# Patient Record
Sex: Male | Born: 1985 | Race: Black or African American | Hispanic: No | Marital: Single | Smoking: Never smoker
Health system: Southern US, Community
[De-identification: ages and names within clinical notes are randomized; demographics above are authoritative.]

## PROBLEM LIST (undated history)

## (undated) DIAGNOSIS — M6282 Rhabdomyolysis: Secondary | ICD-10-CM

## (undated) HISTORY — DX: Rhabdomyolysis: M62.82

---

## 2014-03-13 ENCOUNTER — Encounter (HOSPITAL_COMMUNITY): Payer: Self-pay | Admitting: Emergency Medicine

## 2014-03-13 ENCOUNTER — Emergency Department (HOSPITAL_COMMUNITY)
Admission: EM | Admit: 2014-03-13 | Discharge: 2014-03-13 | Disposition: A | Discharge status: Home / Self Care | Payer: Managed Care, Other (non HMO) | Source: Home / Self Care

## 2014-03-13 DIAGNOSIS — A084 Viral intestinal infection, unspecified: Secondary | ICD-10-CM

## 2014-03-13 DIAGNOSIS — A088 Other specified intestinal infections: Secondary | ICD-10-CM

## 2014-03-13 DIAGNOSIS — R55 Syncope and collapse: Secondary | ICD-10-CM

## 2014-03-13 DIAGNOSIS — S0510XA Contusion of eyeball and orbital tissues, unspecified eye, initial encounter: Secondary | ICD-10-CM

## 2014-03-13 DIAGNOSIS — S0511XA Contusion of eyeball and orbital tissues, right eye, initial encounter: Secondary | ICD-10-CM

## 2014-03-13 MED ORDER — ONDANSETRON 4 MG PO TBDP
ORAL_TABLET | ORAL | Status: AC
  Filled 2014-03-13: qty 1

## 2014-03-13 MED ORDER — ONDANSETRON 4 MG PO TBDP
4.0000 mg | ORAL_TABLET | Freq: Once | ORAL | Status: AC
  Administered 2014-03-13: 4 mg via ORAL

## 2014-03-13 MED ORDER — ONDANSETRON HCL 4 MG PO TABS: 4.0000 mg | ORAL_TABLET | Freq: Four times a day (QID) | ORAL | Status: DC

## 2014-03-13 NOTE — ED Notes (Signed)
Was vomiting while sitting on the toilet and passed out. States he hit his head on the floor.  Has swelling over L eye.  Vision is normal.  Sx. Onset 12 MN .   Vomited and Diarrhea x 10 since then.  Also c/o pain R knee.  No swelling or abrasions noted.  C/o cramps in abdomen.

## 2014-03-13 NOTE — Discharge Instructions (Signed)
Contusion A contusion is a deep bruise. Contusions are the result of an injury that caused bleeding under the skin. The contusion may turn blue, purple, or yellow. Minor injuries will give you a painless contusion, but more severe contusions may stay painful and swollen for a few weeks.  CAUSES  A contusion is usually caused by a blow, trauma, or direct force to an area of the body. SYMPTOMS   Swelling and redness of the injured area.  Bruising of the injured area.  Tenderness and soreness of the injured area.  Pain. DIAGNOSIS  The diagnosis can be made by taking a history and physical exam. An X-ray, CT scan, or MRI may be needed to determine if there were any associated injuries, such as fractures. TREATMENT  Specific treatment will depend on what area of the body was injured. In general, the best treatment for a contusion is resting, icing, elevating, and applying cold compresses to the injured area. Over-the-counter medicines may also be recommended for pain control. Ask your caregiver what the best treatment is for your contusion. HOME CARE INSTRUCTIONS   Put ice on the injured area.  Put ice in a plastic bag.  Place a towel between your skin and the bag.  Leave the ice on for 15-20 minutes, 03-04 times a day.  Only take over-the-counter or prescription medicines for pain, discomfort, or fever as directed by your caregiver. Your caregiver may recommend avoiding anti-inflammatory medicines (aspirin, ibuprofen, and naproxen) for 48 hours because these medicines may increase bruising.  Rest the injured area.  If possible, elevate the injured area to reduce swelling. SEEK IMMEDIATE MEDICAL CARE IF:   You have increased bruising or swelling.  You have pain that is getting worse.  Your swelling or pain is not relieved with medicines. MAKE SURE YOU:   Understand these instructions.  Will watch your condition.  Will get help right away if you are not doing well or get  worse. Document Released: 09/11/2005 Document Revised: 02/24/2012 Document Reviewed: 10/07/2011 [facility] Patient Information 2014 [place], [place].  Viral Gastroenteritis Viral gastroenteritis is also known as stomach flu. This condition affects the stomach and intestinal tract. It can cause sudden diarrhea and vomiting. The illness typically lasts 3 to 8 days. Most people develop an immune response that eventually gets rid of the virus. While this natural response develops, the virus can make you quite ill. CAUSES  Many different viruses can cause gastroenteritis, such as rotavirus or noroviruses. You can catch one of these viruses by consuming contaminated food or water. You may also catch a virus by sharing utensils or other personal items with an infected person or by touching a contaminated surface. SYMPTOMS  The most common symptoms are diarrhea and vomiting. These problems can cause a severe loss of body fluids (dehydration) and a body salt (electrolyte) imbalance. Other symptoms may include:  Fever.  Headache.  Fatigue.  Abdominal pain. DIAGNOSIS  Your caregiver can usually diagnose viral gastroenteritis based on your symptoms and a physical exam. A stool sample may also be taken to test for the presence of viruses or other infections. TREATMENT  This illness typically goes away on its own. Treatments are aimed at rehydration. The most serious cases of viral gastroenteritis involve vomiting so severely that you are not able to keep fluids down. In these cases, fluids must be given through an intravenous line (IV). HOME CARE INSTRUCTIONS   Drink enough fluids to keep your urine clear or pale yellow. Drink small amounts of  fluids frequently and increase the amounts as tolerated.  Ask your caregiver for specific rehydration instructions.  Avoid:  Foods high in sugar.  Alcohol.  Carbonated drinks.  Tobacco.  Juice.  Caffeine drinks.  Extremely hot or cold  fluids.  Fatty, greasy foods.  Too much intake of anything at one time.  Dairy products until 24 to 48 hours after diarrhea stops.  You may consume probiotics. Probiotics are active cultures of beneficial bacteria. They may lessen the amount and number of diarrheal stools in adults. Probiotics can be found in yogurt with active cultures and in supplements.  Wash your hands well to avoid spreading the virus.  Only take over-the-counter or prescription medicines for pain, discomfort, or fever as directed by your caregiver. Do not give aspirin to children. Antidiarrheal medicines are not recommended.  Ask your caregiver if you should continue to take your regular prescribed and over-the-counter medicines.  Keep all follow-up appointments as directed by your caregiver. SEEK IMMEDIATE MEDICAL CARE IF:   You are unable to keep fluids down.  You do not urinate at least once every 6 to 8 hours.  You develop shortness of breath.  You notice blood in your stool or vomit. This may look like coffee grounds.  You have abdominal pain that increases or is concentrated in one small area (localized).  You have persistent vomiting or diarrhea.  You have a fever.  The patient is a child younger than 3 months, and he or she has a fever.  The patient is a child older than 3 months, and he or she has a fever and persistent symptoms.  The patient is a child older than 3 months, and he or she has a fever and symptoms suddenly get worse.  The patient is a baby, and he or she has no tears when crying. MAKE SURE YOU:   Understand these instructions.  Will watch your condition.  Will get help right away if you are not doing well or get worse. Document Released: 12/02/2005 Document Revised: 02/24/2012 Document Reviewed: 09/18/2011 [facility] Patient Information 2014 [place], [place].  Vasovagal Syncope, Adult Syncope, commonly known as fainting, is a temporary loss of consciousness. It occurs when  the blood flow to the brain is reduced. Vasovagal syncope (also called neurocardiogenic syncope) is a fainting spell in which the blood flow to the brain is reduced because of a sudden drop in heart rate and blood pressure. Vasovagal syncope occurs when the brain and the cardiovascular system (blood vessels) do not adequately communicate and respond to each other. This is the most common cause of fainting. It often occurs in response to fear or some other type of emotional or physical stress. The body has a reaction in which the heart starts beating too slowly or the blood vessels expand, reducing blood pressure. This type of fainting spell is generally considered harmless. However, injuries can occur if a person takes a sudden fall during a fainting spell.  CAUSES  Vasovagal syncope occurs when a person's blood pressure and heart rate decrease suddenly, usually in response to a trigger. Many things and situations can trigger an episode. Some of these include:   Pain.   Fear.   The sight of blood or medical procedures, such as blood being drawn from a vein.   Common activities, such as coughing, swallowing, stretching, or going to the bathroom.   Emotional stress.   Prolonged standing, especially in a warm environment.   Lack of sleep or rest.   Prolonged  lack of food.   Prolonged lack of fluids.   Recent illness.  The use of certain drugs that affect blood pressure, such as cocaine, alcohol, marijuana, inhalants, and opiates.  SYMPTOMS  Before the fainting episode, you may:   Feel dizzy or light headed.   Become pale.  Sense that you are going to faint.   Feel like the room is spinning.   Have tunnel vision, only seeing directly in front of you.   Feel sick to your stomach (nauseous).   See spots or slowly lose vision.   Hear ringing in your ears.   Have a headache.   Feel warm and sweaty.   Feel a sensation of pins and needles. During the fainting  spell, you will generally be unconscious for no longer than a couple minutes before waking up and returning to normal. If you get up too quickly before your body can recover, you may faint again. Some twitching or jerky movements may occur during the fainting spell.  DIAGNOSIS  Your caregiver will ask about your symptoms, take a medical history, and perform a physical exam. Various tests may be done to rule out other causes of fainting. These may include blood tests and tests to check the heart, such as electrocardiography, echocardiography, and possibly an electrophysiology study. When other causes have been ruled out, a test may be done to check the body's response to changes in position (tilt table test). TREATMENT  Most cases of vasovagal syncope do not require treatment. Your caregiver may recommend ways to avoid fainting triggers and may provide home strategies for preventing fainting. If you must be exposed to a possible trigger, you can drink additional fluids to help reduce your chances of having an episode of vasovagal syncope. If you have warning signs of an oncoming episode, you can respond by positioning yourself favorably (lying down). If your fainting spells continue, you may be given medicines to prevent fainting. Some medicines may help make you more resistant to repeated episodes of vasovagal syncope. Special exercises or compression stockings may be recommended. In rare cases, the surgical placement of a pacemaker is considered. HOME CARE INSTRUCTIONS   Learn to identify the warning signs of vasovagal syncope.   Sit or lie down at the first warning sign of a fainting spell. If sitting, put your head down between your legs. If you lie down, swing your legs up in the air to increase blood flow to the brain.   Avoid hot tubs and saunas.  Avoid prolonged standing.  Drink enough fluids to keep your urine clear or pale yellow. Avoid caffeine.  Increase salt in your diet as directed  by your caregiver.   If you have to stand for a long time, perform movements such as:   Crossing your legs.   Flexing and stretching your leg muscles.   Squatting.   Moving your legs.   Bending over.   Only take over-the-counter or prescription medicines as directed by your caregiver. Do not suddenly stop any medicines without asking your caregiver first. SEEK MEDICAL CARE IF:   Your fainting spells continue or happen more frequently in spite of treatment.   You lose consciousness for more than a couple minutes.  You have fainting spells during or after exercising or after being startled.   You have new symptoms that occur with the fainting spells, such as:   Shortness of breath.  Chest pain.   Irregular heartbeat.   You have episodes of twitching or jerky movements  that last longer than a few seconds.  You have episodes of twitching or jerky movements without obvious fainting. SEEK IMMEDIATE MEDICAL CARE IF:   You have injuries or bleeding after a fainting spell.   You have episodes of twitching or jerky movements that last longer than 5 minutes.   You have more than one spell of twitching or jerky movements before returning to consciousness after fainting. MAKE SURE YOU:   Understand these instructions.  Will watch your condition.  Will get help right away if you are not doing well or get worse. Document Released: 11/18/2012 Document Reviewed: 11/18/2012 [facility] Patient Information 2014 [place], [place].  Head Injury, Adult You have a head injury. Headaches and throwing up (vomiting) are common after a head injury. It should be easy to wake up from sleeping. Sometimes you must stay in the hospital. Most problems happen within the first 24 hours. Side effects may occur up to 7 10 days after the injury.  WHAT ARE THE TYPES OF HEAD INJURIES? Head injuries can be as minor as a bump. Some head injuries can be more severe. More severe head injuries  include:  A jarring injury to the brain (concussion).  A bruise of the brain (contusion). This mean there is bleeding in the brain that can cause swelling.  A cracked skull (skull fracture).  Bleeding in the brain that collects, clots, and forms a bump (hematoma). . WHEN SHOULD I GET HELP RIGHT AWAY?   You are confused or sleepy.  You cannot be woken up.  You feel sick to your stomach (nauseous) or keep throwing up.  Your dizziness or unsteadiness is get worse.  You have very bad, lasting headaches that are not helped by medicine.  You cannot use your arms or legs like normal  You cannot walk.  You notice changes in the black spots in the center of the colored part of your eye (pupil).  You have clear or bloody fluid coming from your nose or ears.  You have trouble seeing. During the next 24 hours after the injury, you must stay with someone who can watch you. This person should get help right away (call 911 in the [place] if you start to shake and are not able to control it (seizures), you become pass out, or you are unable to wake up. HOW CAN I PREVENT A HEAD INJURY IN THE FUTURE?  Wear seat belts.  Wear helmets while bike riding and playing sports like football.  Stay away from dangerous activities around the house. WHEN CAN I RETURN TO NORMAL ACTIVITIES AND ATHLETICS? See your doctor before doing these activities. You should not do normal activities or play contact sports until 1 week after the following symptoms have stopped:  Headache that does not go away.  Dizziness.  Poor attention.  Confusion.  Memory problems.  Sickness to your stomach or throwing up.  Tiredness.  Fussiness.  Bothered by bright lights or loud noises.  Anxiousness or depression.  Restless sleep. MAKE SURE YOU:   Understand these instructions.  Will watch your condition.  Will get help right away if you are not doing well or get worse. Document Released: 11/14/2008 Document  Revised: 09/22/2013 Document Reviewed: 08/09/2013 [facility] Patient Information 2014 [place], [place].

## 2014-03-13 NOTE — ED Provider Notes (Signed)
CSN: [id number]     Arrival date & time 03/13/14  1723 History   First MD Initiated Contact with Patient 03/13/14 1746     Chief Complaint  Patient presents with  . Emesis  . Loss of Consciousness   (Consider location/radiation/quality/duration/timing/severity/associated sxs/prior Treatment) HPI Comments: 28 y o m with sore throat yesterday. This AM at 0200h N,V,D with fever and malaise. V and D several times.  This PM While sitting on toilet and vomiting simultaneously felt weak and passed out for < 1 min. As he fell struck OD on edge of bathtub.  Denies neuro sx'. No problems with vision, speech, hearing, swallowing, focal weakness, memory or confusion.    History reviewed. No pertinent past medical history. History reviewed. No pertinent past surgical history. History reviewed. No pertinent family history. History  Substance Use Topics  . Smoking status: Never Smoker   . Smokeless tobacco: Not on file  . Alcohol Use: No    Review of Systems  Constitutional: Positive for fever, chills, activity change and appetite change.  HENT: Positive for sore throat. Negative for congestion.        Mild periorbital edema, no eccymosis.  Eyes: Negative for discharge and visual disturbance.  Respiratory: Negative.   Cardiovascular: Negative.   Gastrointestinal: Positive for nausea, vomiting and diarrhea. Negative for blood in stool.       Transient low abd cramps.  Genitourinary: Negative.   Musculoskeletal: Negative.   Skin: Negative.   Neurological: Positive for syncope and headaches. Negative for dizziness, tremors, seizures, facial asymmetry, speech difficulty and numbness.  Psychiatric/Behavioral: Negative.  Negative for behavioral problems, confusion, dysphoric mood, decreased concentration and agitation. The patient is not nervous/anxious and is not hyperactive.     Allergies  Review of patient's allergies indicates no known allergies.  Home Medications   Current Outpatient Rx   Name  Route  Sig  Dispense  Refill  . ondansetron (ZOFRAN) 4 MG tablet   Oral   Take 1 tablet (4 mg total) by mouth every 6 (six) hours. Prn nausea or vomiting   12 tablet   0    BP 114/70  Pulse 98  Temp(Src) 100.3 F (37.9 C)  Resp 16  SpO2 97% Physical Exam  Nursing note and vitals reviewed. Constitutional: He is oriented to person, place, and time. He appears well-developed and well-nourished. No distress.  HENT:  Head: Normocephalic.  Nose: Nose normal.  Mouth/Throat: No oropharyngeal exudate.  bilat TM's nl OP with mild PND Mild R periorbital edema/pufiness.   Eyes: Conjunctivae and EOM are normal. Pupils are equal, round, and reactive to light.  No apparent injury to eye proper. Nl exam. Nl vision.  Neck: Normal range of motion. Neck supple.  No spinal tenderness, deformity  Cardiovascular: Normal rate, regular rhythm, normal heart sounds and intact distal pulses.   Pulmonary/Chest: Effort normal and breath sounds normal. No respiratory distress. He has no wheezes. He has no rales.  Abdominal: Soft. Bowel sounds are normal. He exhibits no distension and no mass. There is no tenderness. There is no rebound and no guarding.  Musculoskeletal: He exhibits no edema and no tenderness.  Lymphadenopathy:    He has no cervical adenopathy.  Neurological: He is alert and oriented to person, place, and time. No cranial nerve deficit. He exhibits normal muscle tone.  Skin: Skin is warm and dry. No rash noted. He is not diaphoretic. No erythema.  Psychiatric: He has a normal mood and affect. His behavior is normal. Judgment  and thought content normal.    ED Course  Procedures (including critical care time) Labs Review Labs Reviewed - No data to display Imaging Review No results found.   MDM   1. Viral gastroenteritis   2. Vasovagal episode   3. Periorbital contusion of right eye     zofran 4 mg 1 dose here and Rx  Fluids as discussed Ice to eye bruise. Rest, no  working Head injury sheet and red flags discussed. Brief vasovagal episode due to combination, vomiting, dehydration. Recovered quickly to nl state and no residual neuro sx's/deficits. Stable and ready to go home.       Hayden Rasmussen, NP 03/13/14 1856  Hayden Rasmussen, NP 03/13/14 1859

## 2014-03-16 NOTE — ED Provider Notes (Signed)
Medical screening examination/treatment/procedure(s) were performed by a resident physician or non-physician practitioner and as the supervising physician I was immediately available for consultation/collaboration.  Rayola Everhart, MD    Nigel Wessman S Dorrien Grunder, MD 03/16/14 0758 

## 2015-03-09 ENCOUNTER — Emergency Department (HOSPITAL_COMMUNITY)
Admission: EM | Admit: 2015-03-09 | Discharge: 2015-03-09 | Disposition: A | Discharge status: Home / Self Care | Payer: Managed Care, Other (non HMO) | Source: Home / Self Care | Attending: Family Medicine | Admitting: Family Medicine

## 2015-03-09 ENCOUNTER — Encounter (HOSPITAL_COMMUNITY): Payer: Self-pay | Admitting: Emergency Medicine

## 2015-03-09 DIAGNOSIS — B349 Viral infection, unspecified: Secondary | ICD-10-CM | POA: Diagnosis not present

## 2015-03-09 LAB — POCT RAPID STREP A: Streptococcus, Group A Screen (Direct): NEGATIVE

## 2015-03-09 MED ORDER — ACETAMINOPHEN 325 MG PO TABS
ORAL_TABLET | ORAL | Status: AC
  Filled 2015-03-09: qty 2

## 2015-03-09 MED ORDER — ACETAMINOPHEN 160 MG/5ML PO SOLN
15.0000 mg/kg | Freq: Once | ORAL | Status: AC
  Administered 2015-03-09: 15:00:00 via ORAL

## 2015-03-09 NOTE — Discharge Instructions (Signed)

## 2015-03-09 NOTE — ED Notes (Signed)
Onset of feeling bad started last night.  Patient works night shift.  Patient had a headache last night.  Patient noted having chills last night.  After working came home and vomited x 1.  Headache and fever continues.

## 2015-03-09 NOTE — ED Provider Notes (Signed)
CSN: [id number]     Arrival date & time 03/09/15  1410 History   First MD Initiated Contact with Patient 03/09/15 1621     Chief Complaint  Patient presents with  . Fever   (Consider location/radiation/quality/duration/timing/severity/associated sxs/prior Treatment) HPI Comments: 29 year old male states that yesterday evening around 7:30 PM he developed a headache. He took Motrin with partial relief. 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 He kept feeling colder and colder and developed a sore throat. This morning he vomited once and had one loose stool. This afternoon he states that his hands felt heavy. He denies abdominal pain, chest pain, shortness of breath, earache, nausea, vomiting; and did not know he had a fever until we measured it here with a value of 103.1    History reviewed. No pertinent past medical history. History reviewed. No pertinent past surgical history. No family history on file. History  Substance Use Topics  . Smoking status: Never Smoker   . Smokeless tobacco: Not on file  . Alcohol Use: No    Review of Systems  Constitutional: Positive for chills and activity change. Negative for fever.  HENT: Positive for sore throat. Negative for congestion, ear pain, postnasal drip and rhinorrhea.   Respiratory: Negative for cough, chest tightness and shortness of breath.   Cardiovascular: Negative for chest pain, palpitations and leg swelling.  Gastrointestinal: Negative for abdominal pain.       As per history of present illness  Genitourinary: Positive for frequency. Negative for dysuria, urgency, discharge, penile pain and testicular pain.  Musculoskeletal: Negative for back pain, neck pain and neck stiffness.  Skin: Negative for rash.  Neurological: Positive for weakness. Negative for dizziness and numbness.    Allergies  Review of patient's allergies indicates no known allergies.  Home Medications   Prior to Admission medications    Medication Sig Start Date End Date Taking? Authorizing Provider  ondansetron (ZOFRAN) 4 MG tablet Take 1 tablet (4 mg total) by mouth every 6 (six) hours. Prn nausea or vomiting 03/13/14   Hayden Rasmussenavid Calab Sachse, NP   BP 90/55 mmHg  Pulse 103  Temp(Src) 103.1 F (39.5 C) (Oral)  Resp 18  SpO2 95% Physical Exam  Constitutional: He is oriented to person, place, and time. He appears well-developed and well-nourished. No distress.  HENT:  Head: Normocephalic and atraumatic.  Mouth/Throat: No oropharyngeal exudate.  Bilateral TMs are normal Oropharynx with cobblestoning, minor erythema and clear PND.  Eyes: Conjunctivae and EOM are normal.  Neck: Normal range of motion. Neck supple.  Cardiovascular: Normal rate, regular rhythm and normal heart sounds.   Pulmonary/Chest: Effort normal and breath sounds normal. No respiratory distress. He has no wheezes. He has no rales.  Abdominal: Soft. Bowel sounds are normal. He exhibits no distension and no mass. There is no tenderness. There is no rebound and no guarding.  Scaphoid.  Musculoskeletal: Normal range of motion. He exhibits no edema.  Lymphadenopathy:    He has no cervical adenopathy.  Neurological: He is alert and oriented to person, place, and time.  Skin: Skin is warm and dry. No rash noted. He is not diaphoretic.  Psychiatric: He has a normal mood and affect.  Nursing note and vitals reviewed.   ED Course  Procedures (including critical care time) Labs Review Labs Reviewed  POCT RAPID STREP A ([facility] ONLY)   Results for orders placed or performed during the hospital encounter of 03/09/15  POCT rapid strep  A [facility])  Result Value Ref Range   Streptococcus, Group A Screen (Direct) NEGATIVE NEGATIVE   Imaging Review No results found.   MDM   1. Viral syndrome    Both urinalysis and strep a screen were negative. Physical exam shows no source for bacterial infection. Suspect that this is a viral syndrome. He is advised  to rest, stat a work for at least couple of days take Motrin for pain and fever. For worsening new symptoms or problems such as vomiting, higher fevers that do not respond to ibuprofen or Tylenol, abdominal pain, cough, chest congestion or shortness of breath sick medical attention promptly.    Hayden Rasmussen, NP 03/09/15 1723

## 2015-03-10 LAB — POCT URINALYSIS DIP (DEVICE)
Glucose, UA: NEGATIVE mg/dL
Ketones, ur: NEGATIVE mg/dL
Leukocytes, UA: NEGATIVE
Nitrite: NEGATIVE
PH: 8.5 — AB (ref 5.0–8.0)
PROTEIN: 100 mg/dL — AB
SPECIFIC GRAVITY, URINE: 1.015 (ref 1.005–1.030)

## 2015-03-13 ENCOUNTER — Telehealth (HOSPITAL_COMMUNITY): Payer: Self-pay | Admitting: *Deleted

## 2015-03-13 LAB — CULTURE, GROUP A STREP: Strep A Culture: POSITIVE — AB

## 2015-03-13 MED ORDER — AMOXICILLIN 500 MG PO CAPS: 500.0000 mg | ORAL_CAPSULE | Freq: Two times a day (BID) | ORAL | Status: DC

## 2015-03-13 NOTE — ED Notes (Signed)
Throat culture: Group A strep. Lab shown to Dr. Piedad ClimesHonig and she printed Rx. Amoxicillin.  I called pt. Pt. verified x 2 and given result.  Pt. told he needs Amoxicillin for strep throat. [facility] [address] [facility]  Rx. called to pharmacist @ [phone]. [name], [name] 03/13/2015

## 2015-09-01 ENCOUNTER — Emergency Department (INDEPENDENT_AMBULATORY_CARE_PROVIDER_SITE_OTHER): Payer: Managed Care, Other (non HMO)

## 2015-09-01 ENCOUNTER — Encounter (HOSPITAL_COMMUNITY): Payer: Self-pay | Admitting: *Deleted

## 2015-09-01 ENCOUNTER — Emergency Department (HOSPITAL_COMMUNITY)
Admission: EM | Admit: 2015-09-01 | Discharge: 2015-09-01 | Disposition: A | Discharge status: Home / Self Care | Payer: Managed Care, Other (non HMO) | Source: Home / Self Care | Attending: Family Medicine | Admitting: Family Medicine

## 2015-09-01 DIAGNOSIS — M778 Other enthesopathies, not elsewhere classified: Secondary | ICD-10-CM | POA: Diagnosis not present

## 2015-09-01 MED ORDER — NAPROXEN 500 MG PO TABS: 500.0000 mg | ORAL_TABLET | Freq: Two times a day (BID) | ORAL | Status: DC

## 2015-09-01 NOTE — ED Notes (Signed)
Pt reports  Pain r arm   With       Pain radiates  Up  The  r  Arm  And  Wrist       denys  Any  specefic  Injury    Pt       Reports       He       Had  A  Similar  Problem in  Past

## 2015-09-01 NOTE — Discharge Instructions (Signed)
Ice, splint and medicine as needed, see orthopedist if further problems. °

## 2015-09-01 NOTE — ED Provider Notes (Signed)
CSN: [id number]     Arrival date & time 09/01/15  1607 History   First MD Initiated Contact with Patient 09/01/15 1727     Chief Complaint  Patient presents with  . Arm Problem   (Consider location/radiation/quality/duration/timing/severity/associated sxs/prior Treatment) Patient is a 29 y.o. male presenting with wrist pain. The history is provided by the patient.  Wrist Pain This is a recurrent problem. The current episode started more than 1 week ago. 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 Exacerbated by: h/o similar problem and given splints to wear but not all the time.    History reviewed. No pertinent past medical history. History reviewed. No pertinent past surgical history. History reviewed. No pertinent family history. Social History  Substance Use Topics  . Smoking status: Never Smoker   . Smokeless tobacco: None  . Alcohol Use: No    Review of Systems  Musculoskeletal: Positive for myalgias. Negative for joint swelling.  Skin: Negative.   Neurological: Negative for weakness and numbness.  All other systems reviewed and are negative.   Allergies  Review of patient's allergies indicates no known allergies.  Home Medications   Prior to Admission medications   Medication Sig Start Date End Date Taking? Authorizing Provider  amoxicillin (AMOXIL) 500 MG capsule Take 1 capsule (500 mg total) by mouth 2 (two) times daily. 03/13/15   Charm Rings, MD  ondansetron (ZOFRAN) 4 MG tablet Take 1 tablet (4 mg total) by mouth every 6 (six) hours. Prn nausea or vomiting 03/13/14   Hayden Rasmussen, NP   Meds Ordered and Administered this Visit  Medications - No data to display  BP 105/64 mmHg  Pulse 47  Temp(Src) 97.9 F (36.6 C) (Oral)  Resp 16  SpO2 99% No data found.   Physical Exam  Constitutional: He is oriented to person, place, and time. He appears well-developed and well-nourished.   Musculoskeletal: He exhibits no edema or tenderness.  Neg tinels and phalens test at right wrist, nvt intact.  Neurological: He is alert and oriented to person, place, and time.  Skin: Skin is warm and dry.  Nursing note and vitals reviewed.   ED Course  Procedures (including critical care time)  Labs Review Labs Reviewed - No data to display  Imaging Review Dg Wrist Complete Right  09/01/2015   CLINICAL DATA:  Per pt: pain, no injury to the right wrist. Patient stated that he thinks that it is job related, feels like pressure in the right wrist. Stated the pain is the entire right wrist, no specific area. Patient is not a diabetic. No previous right wrist injury.  EXAM: RIGHT WRIST - COMPLETE 3+ VIEW  COMPARISON:  None.  FINDINGS: There is no evidence of fracture or dislocation. There is no evidence of arthropathy or other focal bone abnormality. Soft tissues are unremarkable.  IMPRESSION: Negative.   Electronically Signed   By: Amie Portland M.D.   On: 09/01/2015 18:18   X-rays reviewed and report per radiologist.   Visual Acuity Review  Right Eye Distance:   Left Eye Distance:   Bilateral Distance:    Right Eye Near:   Left Eye Near:    Bilateral Near:         MDM   1. Right wrist tendonitis     Wrist splint and naprosyn for care.   Linna Hoff, MD 09/01/15 [phone]

## 2016-02-21 ENCOUNTER — Encounter (HOSPITAL_COMMUNITY): Payer: Self-pay | Admitting: Emergency Medicine

## 2016-02-21 ENCOUNTER — Emergency Department (INDEPENDENT_AMBULATORY_CARE_PROVIDER_SITE_OTHER)
Admission: EM | Admit: 2016-02-21 | Discharge: 2016-02-21 | Disposition: A | Discharge status: Home / Self Care | Payer: Self-pay | Source: Home / Self Care | Attending: Emergency Medicine | Admitting: Emergency Medicine

## 2016-02-21 DIAGNOSIS — S39012A Strain of muscle, fascia and tendon of lower back, initial encounter: Secondary | ICD-10-CM

## 2016-02-21 MED ORDER — CYCLOBENZAPRINE HCL 10 MG PO TABS: 10.0000 mg | ORAL_TABLET | Freq: Three times a day (TID) | ORAL | Status: DC | PRN

## 2016-02-21 MED ORDER — IBUPROFEN 600 MG PO TABS: 600.0000 mg | ORAL_TABLET | Freq: Four times a day (QID) | ORAL | Status: DC | PRN

## 2016-02-21 NOTE — Discharge Instructions (Signed)
You have strained the muscles in your left lower back, causing spasm. Apply heat to your back at least 3 times a day. Take ibuprofen every 6 hours for the next 2-3 days. Use the Flexeril at bedtime to help with muscle tightness and spasm. You can do gentle stretching exercises. This will likely take 1-2 weeks to fully resolve.
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
## 2016-04-15 IMAGING — DX DG WRIST COMPLETE 3+V*R*
4 series · 4 of 4 positions shown · non-contrast
Comparison: None.

CLINICAL DATA: Per pt: pain, no injury to the right wrist. Patient
stated that he thinks that it is job related, feels like pressure in
the right wrist. Stated the pain is the entire right wrist, no
injury.

EXAM:
RIGHT WRIST - COMPLETE 3+ VIEW

[wrist pa]
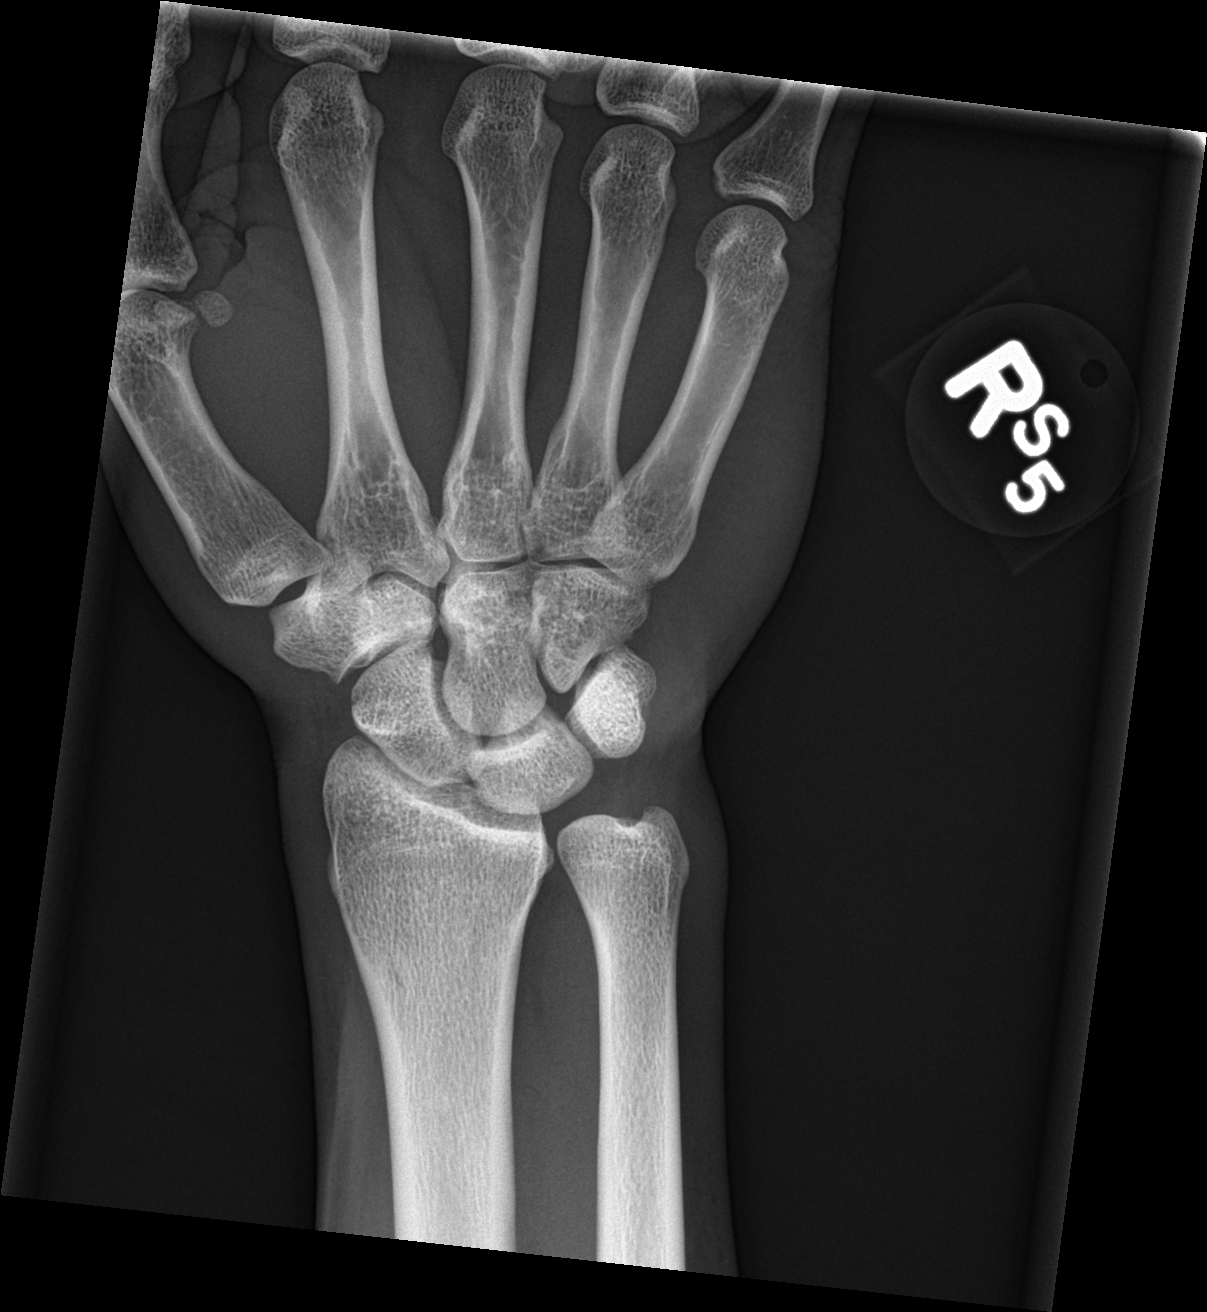

[wrist navicular]
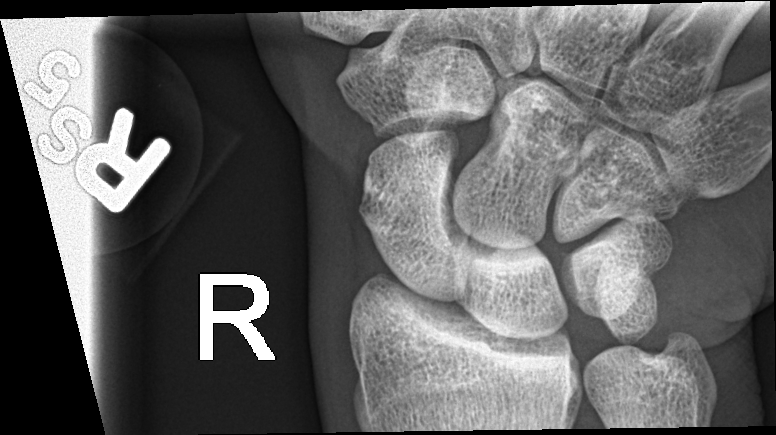

[wrist obl]
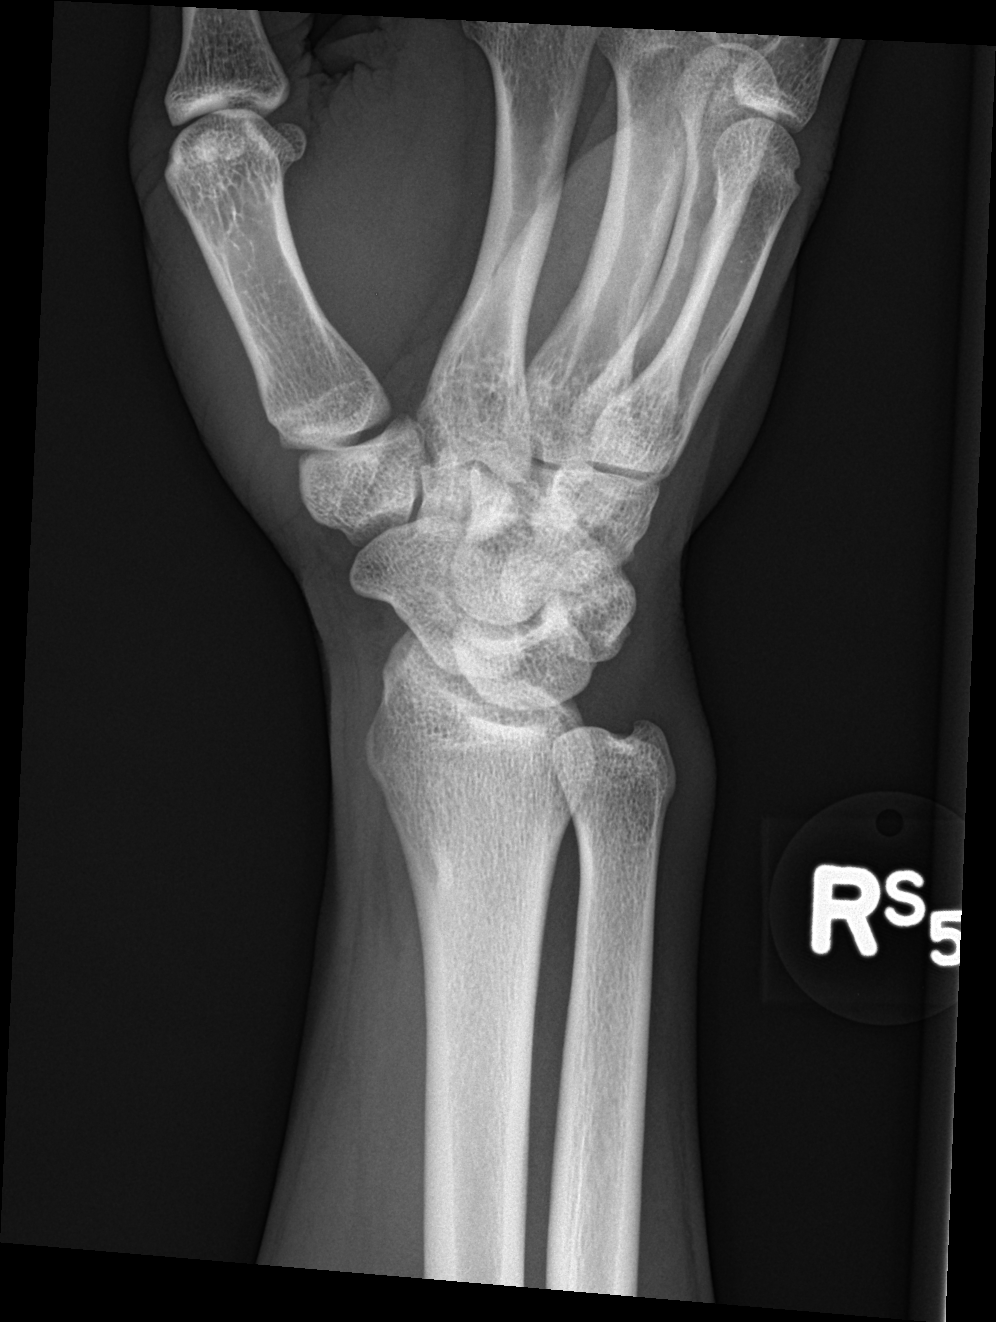

[wrist lat]
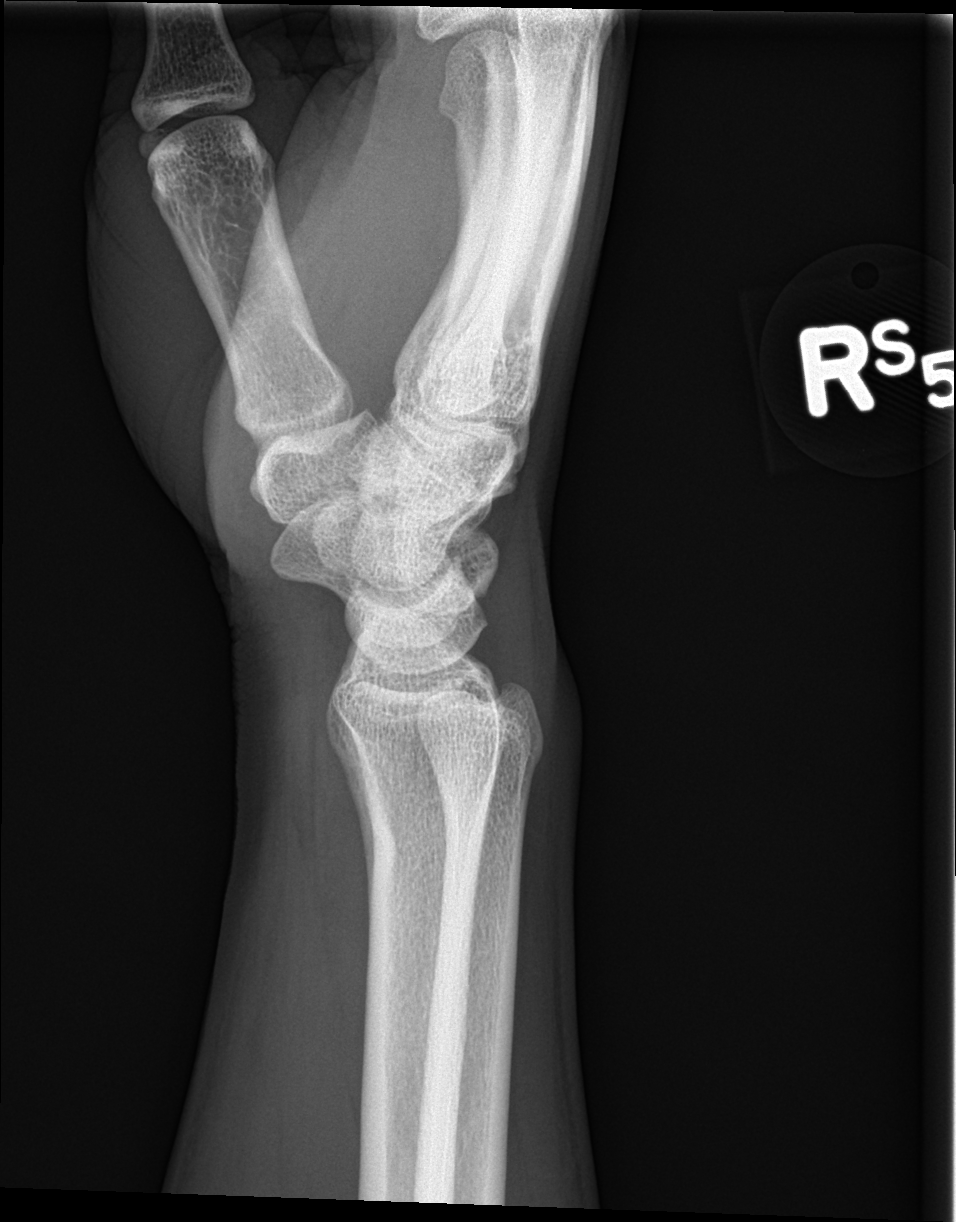

[4 of 4 positions shown; findings below may reference images not displayed]

FINDINGS: There is no evidence of fracture or dislocation. There is no
evidence of arthropathy or other focal bone abnormality. Soft
tissues are unremarkable.
IMPRESSION: Negative.

## 2017-02-03 ENCOUNTER — Encounter (HOSPITAL_COMMUNITY): Payer: Self-pay | Admitting: Emergency Medicine

## 2017-02-03 ENCOUNTER — Emergency Department (HOSPITAL_COMMUNITY)
Admission: EM | Admit: 2017-02-03 | Discharge: 2017-02-03 | Disposition: A | Discharge status: Home / Self Care | Payer: Managed Care, Other (non HMO) | Attending: Emergency Medicine | Admitting: Emergency Medicine

## 2017-02-03 DIAGNOSIS — J029 Acute pharyngitis, unspecified: Secondary | ICD-10-CM | POA: Insufficient documentation

## 2017-02-03 LAB — RAPID STREP SCREEN (MED CTR MEBANE ONLY): Streptococcus, Group A Screen (Direct): NEGATIVE

## 2017-02-03 MED ORDER — IBUPROFEN 400 MG PO TABS
600.0000 mg | ORAL_TABLET | Freq: Once | ORAL | Status: AC
  Administered 2017-02-03: 600 mg via ORAL
  Filled 2017-02-03: qty 1

## 2017-02-03 MED ORDER — DEXAMETHASONE SODIUM PHOSPHATE 10 MG/ML IJ SOLN
10.0000 mg | Freq: Once | INTRAMUSCULAR | Status: AC
  Administered 2017-02-03: 10 mg via INTRAMUSCULAR
  Filled 2017-02-03: qty 1

## 2017-02-03 MED ORDER — DM-GUAIFENESIN ER 30-600 MG PO TB12
2.0000 | ORAL_TABLET | Freq: Once | ORAL | Status: AC
  Administered 2017-02-03: 2 via ORAL
  Filled 2017-02-03: qty 2
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
## 2017-02-03 NOTE — ED Triage Notes (Addendum)
C/o sore throat, mild productive cough with yellow phlegm, and nasal congestion since Friday.

## 2017-02-05 LAB — CULTURE, GROUP A STREP (THRC)

## 2017-10-01 ENCOUNTER — Encounter (HOSPITAL_COMMUNITY): Payer: Self-pay | Admitting: Family Medicine

## 2017-10-01 ENCOUNTER — Ambulatory Visit (HOSPITAL_COMMUNITY)
Admission: EM | Admit: 2017-10-01 | Discharge: 2017-10-01 | Disposition: A | Discharge status: Home / Self Care | Payer: Managed Care, Other (non HMO) | Attending: Family Medicine | Admitting: Family Medicine

## 2017-10-01 DIAGNOSIS — S86911A Strain of unspecified muscle(s) and tendon(s) at lower leg level, right leg, initial encounter: Secondary | ICD-10-CM | POA: Diagnosis not present

## 2017-10-01 DIAGNOSIS — M25561 Pain in right knee: Secondary | ICD-10-CM | POA: Diagnosis not present

## 2017-10-01 MED ORDER — NAPROXEN 500 MG PO TABS: 500.0000 mg | ORAL_TABLET | Freq: Two times a day (BID) | ORAL | 0 refills | Status: DC

## 2017-10-01 NOTE — ED Triage Notes (Signed)
Pt here for pain behind the right knee and tightness. Denies injury.

## 2017-10-01 NOTE — ED Provider Notes (Signed)
[facility]    CSN: [id number] Arrival date & time: 10/01/17  1215     History   Chief Complaint Chief Complaint  Patient presents with  . Knee Pain    HPI Dustin Garcia is a 31 y.o. male. presents with right knee pain which started last night while at work. Works at [employer] center. Pain after stretching which he does before working as he lifts heavy objects regularly during work. Pain now is improved, rates it 2/10. Worsens with flexion and extension of knee. No previous injury. Denies numbness or tingling. Took tylenol last night which did not help. Has not tried any other treatments for pain.   HPI  History reviewed. No pertinent past medical history.  There are no active problems to display for this patient.   History reviewed. No pertinent surgical history.     Home Medications    Prior to Admission medications   Medication Sig Start Date End Date Taking? Authorizing Provider  amoxicillin (AMOXIL) 500 MG capsule Take 1 capsule (500 mg total) by mouth 2 (two) times daily. 03/13/15   Charm Rings, MD  cyclobenzaprine (FLEXERIL) 10 MG tablet Take 1 tablet (10 mg total) by mouth 3 (three) times daily as needed for muscle spasms. 02/21/16   Charm Rings, MD  ibuprofen (ADVIL,MOTRIN) 600 MG tablet Take 1 tablet (600 mg total) by mouth every 6 (six) hours as needed for moderate pain. 02/21/16   Charm Rings, MD  naproxen (NAPROSYN) 500 MG tablet Take 1 tablet (500 mg total) by mouth 2 (two) times daily. 10/01/17   Georgetta Haber, NP  ondansetron (ZOFRAN) 4 MG tablet Take 1 tablet (4 mg total) by mouth every 6 (six) hours. Prn nausea or vomiting 03/13/14   Hayden Rasmussen, NP    Family History History reviewed. No pertinent family history.  Social History Social History  Substance Use Topics  . Smoking status: Never Smoker  . Smokeless tobacco: Never Used  . Alcohol use No     Allergies   Patient has no known allergies.   Review of  Systems Review of Systems  Constitutional: Negative.   Musculoskeletal: Positive for arthralgias. Negative for joint swelling.  Skin: Negative.   Neurological: Negative for weakness.     Physical Exam Triage Vital Signs ED Triage Vitals  Enc Vitals Group     BP 10/01/17 1227 112/68     Pulse Rate 10/01/17 1227 60     Resp 10/01/17 1227 16     Temp 10/01/17 1227 98 F (36.7 C)     Temp Source 10/01/17 1227 Oral     SpO2 10/01/17 1227 100 %     Weight --      Height --      Head Circumference --      Peak Flow --      Pain Score 10/01/17 1230 3     Pain Loc --      Pain Edu? --      Excl. in GC? --    No data found.   Updated Vital Signs BP 112/68 (BP Location: Left Arm)   Pulse 60   Temp 98 F (36.7 C) (Oral)   Resp 16   SpO2 100%   Visual Acuity Right Eye Distance:   Left Eye Distance:   Bilateral Distance:    Right Eye Near:   Left Eye Near:    Bilateral Near:     Physical Exam  Constitutional: He appears  well-developed and well-nourished.  Musculoskeletal:       Right knee: He exhibits normal range of motion, no swelling, no effusion, no ecchymosis, no LCL laxity, no bony tenderness, normal meniscus and no MCL laxity.  Minimal to no pain on complete knee exam, without tendon laxity noted. Non tender on exam. Ambulatory without difficulty.   Skin: Skin is warm and dry. No erythema.     UC Treatments / Results  Labs (all labs ordered are listed, but only abnormal results are displayed) Labs Reviewed - No data to display  EKG  EKG Interpretation None       Radiology No results found.  Procedures Procedures (including critical care time)  Medications Ordered in UC Medications - No data to display   Initial Impression / Assessment and Plan / UC Course  I have reviewed the triage vital signs and the nursing notes.  Pertinent labs & imaging results that were available during my care of the patient were reviewed by me and considered in my  medical decision making (see chart for details).     Consistent with muscle strain at this time. RICE therapy recommended. Naproxen for pain. Patient states he is off work for the next three days so will be able to rest. Patient agreeable to plan and verbalized understanding of instructions. Ambulatory out of clinic without difficulty.   Final Clinical Impressions(s) / UC Diagnoses   Final diagnoses:  Strain of right knee, initial encounter    New Prescriptions Discharge Medication List as of 10/01/2017  1:11 PM       Controlled Substance Prescriptions [place] Controlled Substance Registry consulted? Not Applicable   Georgetta HaberBurky, Natalie B, NP 10/01/17 1327

## 2018-12-31 ENCOUNTER — Encounter (HOSPITAL_COMMUNITY): Payer: Self-pay

## 2018-12-31 ENCOUNTER — Telehealth (HOSPITAL_COMMUNITY): Payer: Self-pay | Admitting: Emergency Medicine

## 2018-12-31 ENCOUNTER — Ambulatory Visit (HOSPITAL_COMMUNITY)
Admission: EM | Admit: 2018-12-31 | Discharge: 2018-12-31 | Disposition: A | Discharge status: Home / Self Care | Payer: Managed Care, Other (non HMO) | Attending: Family Medicine | Admitting: Family Medicine

## 2018-12-31 DIAGNOSIS — R69 Illness, unspecified: Secondary | ICD-10-CM | POA: Diagnosis not present

## 2018-12-31 DIAGNOSIS — J111 Influenza due to unidentified influenza virus with other respiratory manifestations: Secondary | ICD-10-CM

## 2018-12-31 MED ORDER — OSELTAMIVIR PHOSPHATE 75 MG PO CAPS
75.0000 mg | ORAL_CAPSULE | Freq: Two times a day (BID) | ORAL | 0 refills | Status: DC
Start: 2018-12-31 — End: 2018-12-31

## 2018-12-31 MED ORDER — ACETAMINOPHEN 325 MG PO TABS
650.0000 mg | ORAL_TABLET | Freq: Once | ORAL | Status: AC
  Administered 2018-12-31: 650 mg via ORAL

## 2018-12-31 MED ORDER — ACETAMINOPHEN 325 MG PO TABS
ORAL_TABLET | ORAL | Status: AC
  Filled 2018-12-31: qty 2

## 2018-12-31 MED ORDER — OSELTAMIVIR PHOSPHATE 75 MG PO CAPS: 75.0000 mg | ORAL_CAPSULE | Freq: Two times a day (BID) | ORAL | 0 refills | Status: DC

## 2018-12-31 NOTE — ED Triage Notes (Signed)
Pt present fever, body aches and chills.  Symptoms started last night.

## 2018-12-31 NOTE — ED Provider Notes (Signed)
[facility]    CSN: [id number] Arrival date & time: 12/31/18  1213     History   Chief Complaint Chief Complaint  Patient presents with  . Fever  . Generalized Body Aches    HPI Dustin Garcia is a 33 y.o. male.   Pt is a 33 year old male that presents with flu like symptoms since last night. He is having body aches and fever. Symptoms have been constant. He has been taking Theraflu for the symptoms. Recent sick contact positive. No recent traveling. Denies any associated cough, congestion, nausea, vomiting, diarrhea.   ROS per HPI      History reviewed. No pertinent past medical history.  There are no active problems to display for this patient.   History reviewed. No pertinent surgical history.     Home Medications    Prior to Admission medications   Medication Sig Start Date End Date Taking? Authorizing Provider  oseltamivir (TAMIFLU) 75 MG capsule Take 1 capsule (75 mg total) by mouth every 12 (twelve) hours. 12/31/18   Janace ArisBast, Maddex Garlitz A, NP    Family History History reviewed. No pertinent family history.  Social History Social History   Tobacco Use  . Smoking status: Never Smoker  . Smokeless tobacco: Never Used  Substance Use Topics  . Alcohol use: No  . Drug use: No     Allergies   Patient has no known allergies.   Review of Systems Review of Systems   Physical Exam Triage Vital Signs ED Triage Vitals [12/31/18 1248]  Enc Vitals Group     BP (!) 115/58     Pulse Rate (!) 101     Resp 18     Temp (!) 102 F (38.9 C)     Temp Source Skin     SpO2 98 %     Weight      Height      Head Circumference      Peak Flow      Pain Score 0     Pain Loc      Pain Edu?      Excl. in GC?    No data found.  Updated Vital Signs BP (!) 115/58 (BP Location: Right Arm)   Pulse (!) 101   Temp (!) 102 F (38.9 C) (Skin)   Resp 18   SpO2 98%   Visual Acuity Right Eye Distance:   Left Eye Distance:   Bilateral Distance:     Right Eye Near:   Left Eye Near:    Bilateral Near:     Physical Exam Vitals signs and nursing note reviewed.  Constitutional:      Appearance: He is normal weight. He is ill-appearing.  HENT:     Head: Normocephalic and atraumatic.     Right Ear: Tympanic membrane, ear canal and external ear normal.     Left Ear: Tympanic membrane, ear canal and external ear normal.     Nose: Nose normal.     Mouth/Throat:     Pharynx: Oropharynx is clear.  Eyes:     Conjunctiva/sclera: Conjunctivae normal.  Neck:     Musculoskeletal: Normal range of motion.  Cardiovascular:     Rate and Rhythm: Regular rhythm. Tachycardia present.     Heart sounds: Normal heart sounds.  Pulmonary:     Effort: Pulmonary effort is normal.     Breath sounds: Normal breath sounds.  Musculoskeletal: Normal range of motion.  Skin:    General: Skin is  warm and dry.  Neurological:     Mental Status: He is alert.  Psychiatric:        Mood and Affect: Mood normal.      UC Treatments / Results  Labs (all labs ordered are listed, but only abnormal results are displayed) Labs Reviewed - No data to display  EKG None  Radiology No results found.  Procedures Procedures (including critical care time)  Medications Ordered in UC Medications  acetaminophen (TYLENOL) tablet 650 mg (has no administration in time range)    Initial Impression / Assessment and Plan / UC Course  I have reviewed the triage vital signs and the nursing notes.  Pertinent labs & imaging results that were available during my care of the patient were reviewed by me and considered in my medical decision making (see chart for details).     Most likely flu like illness.  Will treat with tamiflu Tylenol/ibuprofen for body aches and fever. Follow up as needed for continued or worsening symptoms  Final Clinical Impressions(s) / UC Diagnoses   Final diagnoses:  Influenza-like illness     Discharge Instructions     We are  treating you for a flulike illness Tamiflu twice a day for 5 days You can do Tylenol or ibuprofen for fever and body aches Follow up as needed for continued or worsening symptoms     ED Prescriptions    Medication Sig Dispense Auth. Provider   oseltamivir (TAMIFLU) 75 MG capsule Take 1 capsule (75 mg total) by mouth every 12 (twelve) hours. 10 capsule Dahlia Byes A, NP     Controlled Substance Prescriptions [place] Controlled Substance Registry consulted? Not Applicable   Janace Aris, NP 12/31/18 1351

## 2018-12-31 NOTE — Discharge Instructions (Addendum)
We are treating you for a flulike illness Tamiflu twice a day for 5 days You can do Tylenol or ibuprofen for fever and body aches Follow up as needed for continued or worsening symptoms

## 2018-12-31 NOTE — Telephone Encounter (Signed)
Pt requesting pharmacy change

## 2019-01-04 ENCOUNTER — Other Ambulatory Visit: Payer: Self-pay

## 2019-01-04 ENCOUNTER — Encounter (HOSPITAL_COMMUNITY): Payer: Self-pay | Admitting: Emergency Medicine

## 2019-01-04 ENCOUNTER — Telehealth (HOSPITAL_COMMUNITY): Payer: Self-pay | Admitting: Emergency Medicine

## 2019-01-04 ENCOUNTER — Ambulatory Visit (HOSPITAL_COMMUNITY)
Admission: EM | Admit: 2019-01-04 | Discharge: 2019-01-04 | Disposition: A | Discharge status: Home / Self Care | Payer: Managed Care, Other (non HMO) | Attending: Internal Medicine | Admitting: Internal Medicine

## 2019-01-04 DIAGNOSIS — M79605 Pain in left leg: Secondary | ICD-10-CM | POA: Diagnosis not present

## 2019-01-04 DIAGNOSIS — M79604 Pain in right leg: Secondary | ICD-10-CM | POA: Diagnosis present

## 2019-01-04 LAB — CBC WITH DIFFERENTIAL/PLATELET
Abs Immature Granulocytes: 0.01 10*3/uL (ref 0.00–0.07)
Basophils Absolute: 0 10*3/uL (ref 0.0–0.1)
Basophils Relative: 0 %
Eosinophils Absolute: 0.1 10*3/uL (ref 0.0–0.5)
Eosinophils Relative: 3 %
HCT: 41 % (ref 39.0–52.0)
Hemoglobin: 13.8 g/dL (ref 13.0–17.0)
Immature Granulocytes: 0 %
Lymphocytes Relative: 41 %
Lymphs Abs: 1.7 10*3/uL (ref 0.7–4.0)
MCH: 29.6 pg (ref 26.0–34.0)
MCHC: 33.7 g/dL (ref 30.0–36.0)
MCV: 88 fL (ref 80.0–100.0)
Monocytes Absolute: 0.3 10*3/uL (ref 0.1–1.0)
Monocytes Relative: 8 %
Neutro Abs: 2 10*3/uL (ref 1.7–7.7)
Neutrophils Relative %: 48 %
Platelets: 246 10*3/uL (ref 150–400)
RBC: 4.66 MIL/uL (ref 4.22–5.81)
RDW: 11.7 % (ref 11.5–15.5)
WBC: 4.2 10*3/uL (ref 4.0–10.5)
nRBC: 0 % (ref 0.0–0.2)

## 2019-01-04 LAB — COMPREHENSIVE METABOLIC PANEL
ALT: 122 U/L — ABNORMAL HIGH (ref 0–44)
AST: 352 U/L — ABNORMAL HIGH (ref 15–41)
Albumin: 4 g/dL (ref 3.5–5.0)
Alkaline Phosphatase: 59 U/L (ref 38–126)
Anion gap: 11 (ref 5–15)
BUN: 7 mg/dL (ref 6–20)
CO2: 28 mmol/L (ref 22–32)
Calcium: 8.7 mg/dL — ABNORMAL LOW (ref 8.9–10.3)
Chloride: 100 mmol/L (ref 98–111)
Creatinine, Ser: 0.8 mg/dL (ref 0.61–1.24)
GFR calc Af Amer: >60 mL/min (ref >60)
GFR calc non Af Amer: >60 mL/min (ref >60)
Glucose, Bld: 94 mg/dL (ref 70–99)
Potassium: 3.5 mmol/L (ref 3.5–5.1)
Sodium: 139 mmol/L (ref 135–145)
Total Bilirubin: 1.4 mg/dL — ABNORMAL HIGH (ref 0.3–1.2)
Total Protein: 6.4 g/dL — ABNORMAL LOW (ref 6.5–8.1)

## 2019-01-04 NOTE — ED Triage Notes (Signed)
Bilateral leg soreness after a virus.  Patient says all he needs is a work note

## 2019-01-04 NOTE — ED Provider Notes (Signed)
[facility]    CSN: [id number] Arrival date & time: 01/04/19  1604     History   Chief Complaint Chief Complaint  Patient presents with  . Leg Pain    HPI Dustin Garcia is a 33 y.o. male.   33 year old male with no chronic medical problems presents to urgent care complaining of weakness.  The patient states that walking short distances caused extreme fatigue in his legs.  He denies pain but admits that sometimes his thighs ache.  He had to leave work early yesterday due to the same complaints.  He was seen here at this facility last week and prescribed Tamiflu for flulike symptoms.  He does not have fever anymore and overall feels better except for the aforementioned symptoms.     History reviewed. No pertinent past medical history.  There are no active problems to display for this patient.   History reviewed. No pertinent surgical history.     Home Medications    Prior to Admission medications   Medication Sig Start Date End Date Taking? Authorizing Provider  oseltamivir (TAMIFLU) 75 MG capsule Take 1 capsule (75 mg total) by mouth every 12 (twelve) hours. 12/31/18  Yes Janace Aris, NP    Family History No family history on file.  Social History Social History   Tobacco Use  . Smoking status: Never Smoker  . Smokeless tobacco: Never Used  Substance Use Topics  . Alcohol use: No  . Drug use: No     Allergies   Patient has no known allergies.   Review of Systems Review of Systems  Constitutional: Negative for chills and fever.  HENT: Negative for sore throat and tinnitus.   Eyes: Negative for redness.  Respiratory: Negative for cough and shortness of breath.   Cardiovascular: Negative for chest pain and palpitations.  Gastrointestinal: Negative for abdominal pain, diarrhea, nausea and vomiting.  Genitourinary: Negative for dysuria, frequency and urgency.  Musculoskeletal: Negative for myalgias.  Skin: Negative for rash.       No  lesions  Neurological: Positive for weakness.  Hematological: Does not bruise/bleed easily.  Psychiatric/Behavioral: Negative for suicidal ideas.     Physical Exam Triage Vital Signs ED Triage Vitals  Enc Vitals Group     BP --      Pulse Rate 01/04/19 1648 (!) 57     Resp 01/04/19 1648 18     Temp 01/04/19 1648 98.1 F (36.7 C)     Temp Source 01/04/19 1648 Temporal     SpO2 01/04/19 1648 100 %     Weight --      Height --      Head Circumference --      Peak Flow --      Pain Score 01/04/19 1646 8     Pain Loc --      Pain Edu? --      Excl. in GC? --    No data found.  Updated Vital Signs Pulse (!) 57   Temp 98.1 F (36.7 C) (Temporal)   Resp 18   SpO2 100%   Visual Acuity Right Eye Distance:   Left Eye Distance:   Bilateral Distance:    Right Eye Near:   Left Eye Near:    Bilateral Near:     Physical Exam Constitutional:      General: He is not in acute distress.    Appearance: He is well-developed.  HENT:     Head: Normocephalic and atraumatic.  Eyes:  General: Scleral icterus present.     Conjunctiva/sclera: Conjunctivae normal.     Pupils: Pupils are equal, round, and reactive to light.  Neck:     Musculoskeletal: Normal range of motion and neck supple.     Thyroid: No thyromegaly.     Vascular: No JVD.     Trachea: No tracheal deviation.  Cardiovascular:     Rate and Rhythm: Normal rate and regular rhythm.     Heart sounds: Normal heart sounds. No murmur. No friction rub. No gallop.   Pulmonary:     Effort: Pulmonary effort is normal. No respiratory distress.     Breath sounds: Normal breath sounds.  Abdominal:     General: Bowel sounds are normal. There is no distension.     Palpations: Abdomen is soft.     Tenderness: There is no abdominal tenderness.  Musculoskeletal: Normal range of motion.  Lymphadenopathy:     Cervical: No cervical adenopathy.  Skin:    General: Skin is warm and dry.     Findings: No erythema or rash.    Neurological:     Mental Status: He is alert and oriented to person, place, and time.     Cranial Nerves: No cranial nerve deficit.  Psychiatric:        Behavior: Behavior normal.        Thought Content: Thought content normal.        Judgment: Judgment normal.      UC Treatments / Results  Labs (all labs ordered are listed, but only abnormal results are displayed) Labs Reviewed  COMPREHENSIVE METABOLIC PANEL  HEPATITIS PANEL, ACUTE  CBC WITH DIFFERENTIAL/PLATELET    EKG None  Radiology No results found.  Procedures Procedures (including critical care time)  Medications Ordered in UC Medications - No data to display  Initial Impression / Assessment and Plan / UC Course  I have reviewed the triage vital signs and the nursing notes.  Pertinent labs & imaging results that were available during my care of the patient were reviewed by me and considered in my medical decision making (see chart for details).     Further discussion reveals that the patient had nausea vomiting and diarrhea approximately 1 week prior to starting Tamiflu.  He states that it resolved within a day or 2.  However, in light of mild scleral icterus on exam and malaise/fatigue I have ordered liver enzymes as well as an acute hepatitis panel and CBC.  If symptoms persist or do not improve patient will need to follow-up with primary care/GI/ED.  Final Clinical Impressions(s) / UC Diagnoses   Final diagnoses:  None   Discharge Instructions   None    ED Prescriptions    None     Controlled Substance Prescriptions [place] Controlled Substance Registry consulted? Not Applicable   Arnaldo Natal, MD 01/04/19 [phone]

## 2019-01-04 NOTE — Telephone Encounter (Signed)
Elevated liver enzymes, reviewed by Dr. Sheryle Hail. Patient needs follow up with PCP. Pt given results, given information for Primary care at [facility], told to come back and be seen for any reason if needed. Patient verbalized understanding and agreeable to plan. All questions answered.

## 2019-01-05 LAB — HEPATITIS PANEL, ACUTE
HCV Ab: <0.1 s/co ratio (ref 0.0–0.9)
Hep A IgM: NEGATIVE
Hep B C IgM: NEGATIVE
Hepatitis B Surface Ag: NEGATIVE

## 2019-01-06 ENCOUNTER — Ambulatory Visit (HOSPITAL_COMMUNITY)
Admission: EM | Admit: 2019-01-06 | Discharge: 2019-01-06 | Disposition: A | Discharge status: Home / Self Care | Payer: Managed Care, Other (non HMO)

## 2019-01-07 ENCOUNTER — Telehealth (HOSPITAL_COMMUNITY): Payer: Self-pay | Admitting: Emergency Medicine

## 2019-01-07 NOTE — Telephone Encounter (Signed)
Hepatitis Panel negative. Attempted to contact patient about results. No answer, voicemail left.

## 2019-01-21 ENCOUNTER — Encounter

## 2019-01-21 ENCOUNTER — Encounter: Payer: Self-pay | Admitting: Family Medicine

## 2019-01-21 ENCOUNTER — Ambulatory Visit (INDEPENDENT_AMBULATORY_CARE_PROVIDER_SITE_OTHER): Payer: Managed Care, Other (non HMO) | Admitting: Family Medicine

## 2019-01-21 VITALS — BP 107/71 | HR 64 | Resp 17 | Ht 67.0 in | Wt 173.0 lb

## 2019-01-21 DIAGNOSIS — M6282 Rhabdomyolysis: Secondary | ICD-10-CM

## 2019-01-21 DIAGNOSIS — Z7689 Persons encountering health services in other specified circumstances: Secondary | ICD-10-CM

## 2019-01-21 LAB — POCT URINALYSIS DIP (CLINITEK)
Bilirubin, UA: NEGATIVE
Blood, UA: NEGATIVE
Glucose, UA: NEGATIVE mg/dL
Ketones, POC UA: NEGATIVE mg/dL
Leukocytes, UA: NEGATIVE
Nitrite, UA: NEGATIVE
POC PROTEIN,UA: NEGATIVE
Spec Grav, UA: >=1.03 — AB (ref 1.010–1.025)
UROBILINOGEN UA: 2 U/dL — AB
pH, UA: 7 (ref 5.0–8.0)

## 2019-01-21 NOTE — Patient Instructions (Addendum)
Thank you for choosing Primary Care at [facility] to be your medical home!    Dustin Garcia was seen by Joaquin Courts, FNP today.   Apolinar Junes Boer's primary care provider is Bing Neighbors, FNP.   For the best care possible, you should try to see Joaquin Courts, FNP-C whenever you come to the clinic.   We look forward to seeing you again soon!  If you have any questions about your visit today, please call us at [phone] or feel free to reach your primary care provider via MyChart.      Rhabdomyolysis Rhabdomyolysis is a condition that happens when muscle cells break down and release substances into the blood that can damage the kidneys. This condition happens because of damage to the muscles that move bones (skeletal muscle). When the skeletal muscles are damaged, substances inside the muscle cells go into the blood. One of these substances is a protein called myoglobin. Large amounts of myoglobin can cause kidney damage or kidney failure. Other substances that are released by muscle cells may upset the balance of the minerals (electrolytes) in your blood. This imbalance causes your blood to have too much acid (acidosis). What are the causes? This condition is caused by muscle damage. Muscle damage often happens because of:  Using your muscles too much.  An injury that crushes or squeezes a muscle too tightly.  Using illegal drugs, mainly cocaine.  Alcohol abuse. Other possible causes include:  Prescription medicines, such as those that: ? Lower cholesterol (statins). ? Treat ADHD (attention deficit hyperactivity disorder) or help with weight loss (amphetamines). ? Treat pain (opiates).  Infections.  Muscle diseases that are passed down from parent to child (inherited).  High fever.  Heatstroke.  Not having enough fluids in your body (dehydration).  Seizures.  Surgery. What increases the risk? This condition is more likely to develop in people  who:  Have a family history of muscle disease.  Take part in extreme sports, such as running in marathons.  Have diabetes.  Are older.  Abuse drugs or alcohol. What are the signs or symptoms? Symptoms of this condition vary. Some people have very few symptoms, and other people have many symptoms. The most common symptoms include:  Muscle pain and swelling.  Weak muscles.  Dark urine.  Feeling weak and tired. Other symptoms include:  Nausea and vomiting.  Fever.  Pain in the abdomen.  Pain in the joints. Symptoms of complications from this condition include:  Heart rhythm that is not normal (arrhythmia).  Seizures.  Not urinating enough because of kidney failure.  Very low blood pressure (shock). Signs of shock include dizziness, blurry vision, and clammy skin.  Bleeding that is hard to stop or control. How is this diagnosed? This condition is diagnosed based on your medical history, your symptoms, and a physical exam. Tests may also be done, including:  Blood tests.  Urine tests to check for myoglobin. You may also have other tests to check for causes of muscle damage and to check for complications. How is this treated? Treatment for this condition helps to:  Make sure you have enough fluids in your body.  Lower the acid levels in your blood to reverse acidosis.  Protect your kidneys. Treatment may include:  Fluids and medicines given through an IV tube that is inserted into one of your veins.  Medicines to lower acidosis or to bring back the balance of the minerals in your body.  Hemodialysis. This treatment uses an artificial kidney  machine to filter your blood while you recover. You may have this if other treatments are not helping. Follow these instructions at home:   Take over-the-counter and prescription medicines only as told by your health care provider.  Rest at home until your health care provider says that you can return to your normal  activities.  Drink enough fluid to keep your urine clear or pale yellow.  Do not do activities that take a lot of effort (are strenuous). Ask your health care provider what level of exercise is safe for you.  Do not abuse drugs or alcohol. If you are having problems with drug or alcohol use, ask your health care provider for help.  Keep all follow-up visits as told by your health care provider. This is important. Contact a health care provider if:  You start having symptoms of this condition after treatment. Get help right away if:  You have a seizure.  You bleed easily or cannot control bleeding.  You cannot urinate.  You have chest pain.  You have trouble breathing. This information is not intended to replace advice given to you by your health care provider. Make sure you discuss any questions you have with your health care provider. Document Released: 11/14/2004 Document Revised: 09/13/2016 Document Reviewed: 09/13/2016 Elsevier Interactive Patient Education  2019 [employer]

## 2019-01-21 NOTE — Progress Notes (Signed)
Dustin Garcia, is a 33 y.o. male  NKN:[id number]  FXT:[id number]  DOB - 1986/11/04  CC:  Chief Complaint  Patient presents with  . Establish Care  . Follow-up    UC 1/20: B leg pain, elevated LFTs. legs are mostly better. some movements still hurts. hepatitis panel was negative. no excessive drinking       HPI: Dustin Garcia is a 33 y.o. male is here today to establish care and ER follow-up   Dustin Garcia does not have a problem list on file.   Patient presented to [facility] 12/31/18 for systems which were thought to be related to influenza. He subsequently developed bilateral leg pain and weakness and re-presented to the ER 01/04/19 and was found to have scleral icterus. He subsequently developed amber colored urine and was found to have elevated LFTs. He presented to [facility] and further work-up concluded patient suffered from Rhabdomyolysis. CK peaked at [address]. Last LFT's peaked 800-900 range per urology notes. He denied use of stimulant medications, illicit drugs, and is not prescribed statin therapy. The only new medication patient had taken prior to symptoms occurring was Tamiflu for treatment to flu-like illness.  Today he reports resolution of further body aches, fatigue, or malaise. Urine color is normal. Denies changes in stools or abdominal pain.    Current medications:No current outpatient medications on file.   Pertinent family medical history: family history is not on file.   No Known Allergies  Social History   Socioeconomic History  . Marital status: Single    Spouse name: Not on file  . Number of children: Not on file  . Years of education: Not on file  . Highest education level: Not on file  Occupational History  . Not on file  Social Needs  . Financial resource strain: Not on file  . Food insecurity:    Worry: Not on file    Inability: Not on file  . Transportation needs:    Medical: Not on file    Non-medical: Not on file  Tobacco Use   . Smoking status: Never Smoker  . Smokeless tobacco: Never Used  Substance and Sexual Activity  . Alcohol use: No  . Drug use: No  . Sexual activity: Not on file  Lifestyle  . Physical activity:    Days per week: Not on file    Minutes per session: Not on file  . Stress: Not on file  Relationships  . Social connections:    Talks on phone: Not on file    Gets together: Not on file    Attends religious service: Not on file    Active member of club or organization: Not on file    Attends meetings of clubs or organizations: Not on file    Relationship status: Not on file  . Intimate partner violence:    Fear of current or ex partner: Not on file    Emotionally abused: Not on file    Physically abused: Not on file    Forced sexual activity: Not on file  Other Topics Concern  . Not on file  Social History Narrative  . Not on file    Review of Systems: Pertinent negatives listed in HPI Objective:   Vitals:   01/21/19 1520  BP: 107/71  Pulse: 64  Resp: 17  SpO2: 96%    BP Readings from Last 3 Encounters:  01/21/19 107/71  12/31/18 (!) 115/58  10/01/17 112/68    Filed Weights   01/21/19  1520  Weight: 173 lb (78.5 kg)      Physical Exam: Constitutional: Patient appears well-developed and well-nourished. No distress. HENT: Normocephalic, atraumatic, External right and left ear normal. Oropharynx is clear and moist.  Eyes: Conjunctivae and EOM are normal. PERRLA, no scleral icterus. Neck: Normal ROM. Neck supple. No JVD. No tracheal deviation. No thyromegaly. CVS: RRR, S1/S2 +, no murmurs, no gallops, no carotid bruit.  Pulmonary: Effort and breath sounds normal, no stridor, rhonchi, wheezes, rales.  Abdominal: Soft. BS +, no distension, tenderness, rebound or guarding.  Musculoskeletal: Normal range of motion. No edema and no tenderness.  Neuro: Alert. Normal muscle tone coordination. Normal gait. No cranial nerve deficit. Skin: Skin is warm and dry. No rash  noted. Not diaphoretic. No erythema. No pallor. Psychiatric: Normal mood and affect. Behavior, judgment, thought content normal.  Lab Results (prior encounters)  Lab Results  Component Value Date   WBC 4.2 01/04/2019   HGB 13.8 01/04/2019   HCT 41.0 01/04/2019   MCV 88.0 01/04/2019   PLT 246 01/04/2019   Lab Results  Component Value Date   CREATININE 0.80 01/04/2019   BUN 7 01/04/2019   NA 139 01/04/2019   K 3.5 01/04/2019   CL 100 01/04/2019   CO2 28 01/04/2019     Assessment and plan:  1. Encounter to establish care 2. Non-traumatic rhabdomyolysis, suspect secondary to anti-viral therapy with Tamiflu. A case if cited in the literature which a patient developed similar symptoms and subsequently rhabdomyolysis following treatment with Tamiflu.  (Rhabdomyolysis Following Initiation of Antiviral Therapy with Oseltamivir) doi: 10.12659/AJCR.909278  Similar case was documented in the literature  Patient adamantly denies any use of any illicit drugs or stimulant use. Rechecking the following labs:   - CK - Comprehensive metabolic panel If LFTS remain grossly elevated, will obtain a liver ultrasound - POCT URINALYSIS DIP (CLINITEK) - CBC   As needed for CPE with fasting labs   A total of 30 minutes spent, greater than 50 % of this time was spent counseling and coordination of care.   The patient was given clear instructions to go to ER or return to medical center if symptoms don't improve, worsen or new problems develop. The patient verbalized understanding. The patient was advised  to call and obtain lab results if they haven't heard anything from out office within 7-10 business days.  Joaquin Courts, FNP Primary Care at [facility] [address], [place] [address] [phone]: [phone]    This note has been created with Dragon speech recognition software and Paediatric nurse. Any transcriptional errors are unintentional.

## 2019-01-21 NOTE — Progress Notes (Deleted)
Patient ID: Dustin Garcia, male    DOB: 06/21/1986, 33 y.o.   MRN: [id number]  PCP: Bing NeighborsHarris, Kimberly S, FNP  Chief Complaint  Patient presents with  . Establish Care  . Follow-up    UC 1/20: B leg pain, elevated LFTs. legs are mostly better. some movements still hurts. hepatitis panel was negative. no excessive drinking    Subjective:  HPI  Dustin DerBrandon Ma is a 33 y.o. male presents for evaluation and urgent care follow up.He originally presented to the urgent care on 1/20 with complaints of bilateral thigh pain, and elevated  LFT's. Previously he was evaluated and treated for flu and prescribed tamiflu at [facility] care on 1/16. After treatment 4 days later he developed neck pain that then moved to bilateral thigh pain. He denies any known factors to precipitate thigh pain and weakness. Today he presents with resolved symptoms of malaise, and leg pain. Pt reports that he feels "98%" back to baseline. He denies chest pain, palpitations, and shortness of breath.   Social History   Socioeconomic History  . Marital status: Single    Spouse name: Not on file  . Number of children: Not on file  . Years of education: Not on file  . Highest education level: Not on file  Occupational History  . Not on file  Social Needs  . Financial resource strain: Not on file  . Food insecurity:    Worry: Not on file    Inability: Not on file  . Transportation needs:    Medical: Not on file    Non-medical: Not on file  Tobacco Use  . Smoking status: Never Smoker  . Smokeless tobacco: Never Used  Substance and Sexual Activity  . Alcohol use: No  . Drug use: No  . Sexual activity: Not on file  Lifestyle  . Physical activity:    Days per week: Not on file    Minutes per session: Not on file  . Stress: Not on file  Relationships  . Social connections:    Talks on phone: Not on file    Gets together: Not on file    Attends religious service: Not on file    Active member of club or  organization: Not on file    Attends meetings of clubs or organizations: Not on file    Relationship status: Not on file  . Intimate partner violence:    Fear of current or ex partner: Not on file    Emotionally abused: Not on file    Physically abused: Not on file    Forced sexual activity: Not on file  Other Topics Concern  . Not on file  Social History Narrative  . Not on file    No family history on file.   Review of Systems  Constitutional: Negative for appetite change, chills, diaphoresis, fatigue and fever.  HENT: Negative for congestion and rhinorrhea.   Respiratory: Negative for cough, chest tightness, shortness of breath and wheezing.   Cardiovascular: Negative for chest pain, palpitations and leg swelling.  Gastrointestinal: Negative for abdominal pain, blood in stool, constipation, diarrhea, nausea and vomiting.  Genitourinary: Negative for dysuria and hematuria.  Musculoskeletal: Positive for myalgias. Negative for back pain, neck pain and neck stiffness.       Bilateral thigh pain is resolved, but does have some soreness with stretching movments  Skin: Negative for color change and rash.  Neurological: Negative for dizziness, weakness, light-headedness, numbness and headaches.    There are no  active problems to display for this patient.   Allergies  Allergen Reactions  . Tamiflu [Oseltamivir]     Possible Rhabdomyolysis reaction      Prior to Admission medications   Not on File    Past Medical, Surgical Family and Social History reviewed and updated.    Objective:   Today's Vitals   01/21/19 1520  BP: 107/71  Pulse: 64  Resp: 17  SpO2: 96%  Weight: 173 lb (78.5 kg)  Height: 5\' 7"  (1.702 m)    Wt Readings from Last 3 Encounters:  01/21/19 173 lb (78.5 kg)  02/03/17 179 lb 2 oz (81.3 kg)     Physical Exam Constitutional:      Appearance: Normal appearance. He is normal weight.  HENT:     Head: Normocephalic.  Neck:     Musculoskeletal:  Normal range of motion. No muscular tenderness.  Cardiovascular:     Rate and Rhythm: Normal rate and regular rhythm.     Pulses: Normal pulses.     Heart sounds: Normal heart sounds.  Pulmonary:     Effort: Pulmonary effort is normal.     Breath sounds: Normal breath sounds.  Musculoskeletal: Normal range of motion.        General: No swelling, tenderness or deformity.     Right lower leg: No edema.     Left lower leg: No edema.  Skin:    General: Skin is warm and dry.  Neurological:     General: No focal deficit present.     Mental Status: He is alert and oriented to person, place, and time.     Motor: No weakness.     Gait: Gait normal.  Psychiatric:        Mood and Affect: Mood normal.        Behavior: Behavior normal.        Thought Content: Thought content normal.        Judgment: Judgment normal.     No results found for: POCGLU  No results found for: HGBA1C          Assessment & Plan:  1. Encounter to establish care Follow up as needed  2. Non-traumatic rhabdomyolysis - CK - Comprehensive metabolic panel - POCT URINALYSIS DIP (CLINITEK) - CBC     A total of 20 minutes spent, greater than 50 % of this time was spent assessing, counseling and coordination of care.   If symptoms worsen or do not improve, return for follow-up, follow-up with PCP, or at the emergency department if severity of symptoms warrant a higher level of care.     Legrand Pittsiffany Jazzy Parmer, FNP-S [facility] [address], [place]: [phone]  [phone]

## 2019-01-22 LAB — CK: CK TOTAL: 234 U/L — AB (ref 24–204)

## 2019-01-22 LAB — COMPREHENSIVE METABOLIC PANEL
A/G RATIO: 2.2 (ref 1.2–2.2)
ALT: 81 IU/L — ABNORMAL HIGH (ref 0–44)
AST: 27 IU/L (ref 0–40)
Albumin: 4.2 g/dL (ref 4.1–5.2)
Alkaline Phosphatase: 82 IU/L (ref 39–117)
BILIRUBIN TOTAL: 0.8 mg/dL (ref 0.0–1.2)
BUN/Creatinine Ratio: 9 (ref 9–20)
BUN: 8 mg/dL (ref 6–20)
CHLORIDE: 103 mmol/L (ref 96–106)
CO2: 20 mmol/L (ref 20–29)
Calcium: 9.5 mg/dL (ref 8.7–10.2)
Creatinine, Ser: 0.85 mg/dL (ref 0.76–1.27)
GFR calc non Af Amer: 117 mL/min/{1.73_m2} (ref >59)
GFR, EST AFRICAN AMERICAN: 135 mL/min/{1.73_m2} (ref >59)
GLUCOSE: 84 mg/dL (ref 65–99)
Globulin, Total: 1.9 g/dL (ref 1.5–4.5)
POTASSIUM: 4.5 mmol/L (ref 3.5–5.2)
Sodium: 139 mmol/L (ref 134–144)
TOTAL PROTEIN: 6.1 g/dL (ref 6.0–8.5)

## 2019-01-22 LAB — CBC
HEMOGLOBIN: 13.8 g/dL (ref 13.0–17.7)
Hematocrit: 41.6 % (ref 37.5–51.0)
MCH: 30.6 pg (ref 26.6–33.0)
MCHC: 33.2 g/dL (ref 31.5–35.7)
MCV: 92 fL (ref 79–97)
Platelets: 468 10*3/uL — ABNORMAL HIGH (ref 150–450)
RBC: 4.51 x10E6/uL (ref 4.14–5.80)
RDW: 12.2 % (ref 11.6–15.4)
WBC: 6.4 10*3/uL (ref 3.4–10.8)

## 2019-01-25 ENCOUNTER — Telehealth: Payer: Self-pay | Admitting: Family Medicine

## 2019-01-25 DIAGNOSIS — M6282 Rhabdomyolysis: Secondary | ICD-10-CM | POA: Insufficient documentation

## 2019-01-26 NOTE — Telephone Encounter (Signed)
Called patient and reviewed lab work. Advised pt to call and schedule follow-up lab work in 4-6 weeks.

## 2019-09-28 ENCOUNTER — Telehealth: Payer: Self-pay

## 2019-09-28 NOTE — Telephone Encounter (Signed)

## 2019-09-29 ENCOUNTER — Ambulatory Visit (INDEPENDENT_AMBULATORY_CARE_PROVIDER_SITE_OTHER): Payer: Managed Care, Other (non HMO) | Admitting: Nurse Practitioner

## 2019-09-29 ENCOUNTER — Other Ambulatory Visit: Payer: Self-pay

## 2019-09-29 VITALS — BP 106/68 | HR 54 | Temp 97.3°F | Resp 17 | Wt 186.8 lb

## 2019-09-29 DIAGNOSIS — Z13220 Encounter for screening for lipoid disorders: Secondary | ICD-10-CM | POA: Diagnosis not present

## 2019-09-29 DIAGNOSIS — Z130 Encounter for screening for diseases of the blood and blood-forming organs and certain disorders involving the immune mechanism: Secondary | ICD-10-CM | POA: Diagnosis not present

## 2019-09-29 DIAGNOSIS — M6282 Rhabdomyolysis: Secondary | ICD-10-CM | POA: Diagnosis not present

## 2019-09-29 DIAGNOSIS — R140 Abdominal distension (gaseous): Secondary | ICD-10-CM

## 2019-09-29 NOTE — Progress Notes (Signed)
Assessment & Plan:  Dustin Garcia was seen today for follow-up.  Diagnoses and all orders for this visit:  Abdominal bloating -     CMP14+EGFR -     CBC Avoid daily which can cause lactose intolerance Eat slow as eating too fast can cause air to enter the stomach If you eat a lot of high fiber foods then decrease the amount as this can also cause bloating, Drink more water. Carbonated drinks can cause bloating.    Non-traumatic rhabdomyolysis -     CMP14+EGFR -     CK  Screening for deficiency anemia -     CBC  Lipid screening -     Lipid panel    Patient has been counseled on age-appropriate routine health concerns for screening and prevention. These are reviewed and up-to-date. Referrals have been placed accordingly. Immunizations are up-to-date or declined.    Subjective:   Chief Complaint  Patient presents with  . Follow-up   HPI Dustin Garcia 33 y.o. male presents to office today with complaints of abdominal bloating.   has a past medical history of Rhabdomyolysis.   GI symptoms Abdominal bloating. Onset 1 month ago. Mostly occurs after eating. Diet is not healthy. Consists of high fats, carbs, sugars, etc. Denies abdominal pain, nausea, vomiting, diarrhea. States bowel movements are normal with no melena, hemorrhoids, hematochezia. No excessive flatulence or burping. Denies any daily alcohol use or GU symptoms.    Review of Systems  Constitutional: Negative for fever, malaise/fatigue and weight loss.  HENT: Negative.  Negative for nosebleeds.   Eyes: Negative.  Negative for blurred vision, double vision and photophobia.  Respiratory: Negative.  Negative for cough and shortness of breath.   Cardiovascular: Negative.  Negative for chest pain, palpitations and leg swelling.  Gastrointestinal: Negative for abdominal pain, blood in stool, constipation, diarrhea, heartburn, melena, nausea and vomiting.       SEE HPI  Genitourinary: Negative.   Musculoskeletal:  Negative.  Negative for myalgias.  Neurological: Negative.  Negative for dizziness, focal weakness, seizures and headaches.  Psychiatric/Behavioral: Negative.  Negative for suicidal ideas.    Past Medical History:  Diagnosis Date  . Rhabdomyolysis     No past surgical history on file.  No family history on file.  Social History Reviewed with no changes to be made today.   No outpatient medications prior to visit.   No facility-administered medications prior to visit.     Allergies  Allergen Reactions  . Tamiflu [Oseltamivir]     Possible Rhabdomyolysis reaction         Objective:    BP 106/68   Pulse (!) 54   Temp (!) 97.3 F (36.3 C) (Temporal)   Resp 17   Wt 186 lb 12.8 oz (84.7 kg)   SpO2 96%   BMI 29.26 kg/m  Wt Readings from Last 3 Encounters:  09/29/19 186 lb 12.8 oz (84.7 kg)  01/21/19 173 lb (78.5 kg)  02/03/17 179 lb 2 oz (81.3 kg)    Physical Exam Vitals signs and nursing note reviewed.  Constitutional:      Appearance: He is well-developed.  HENT:     Head: Normocephalic and atraumatic.  Neck:     Musculoskeletal: Normal range of motion.  Cardiovascular:     Rate and Rhythm: Normal rate and regular rhythm.     Heart sounds: Normal heart sounds. No murmur. No friction rub. No gallop.   Pulmonary:     Effort: Pulmonary effort is normal. No tachypnea  or respiratory distress.     Breath sounds: Normal breath sounds. No decreased breath sounds, wheezing, rhonchi or rales.  Chest:     Chest wall: No tenderness.  Abdominal:     General: Bowel sounds are normal. There is no distension.     Palpations: Abdomen is soft. There is no mass.     Tenderness: There is no abdominal tenderness. There is no right CVA tenderness, guarding or rebound.     Hernia: No hernia is present.  Musculoskeletal: Normal range of motion.  Skin:    General: Skin is warm and dry.  Neurological:     Mental Status: He is alert and oriented to person, place, and time.      Coordination: Coordination normal.  Psychiatric:        Behavior: Behavior normal. Behavior is cooperative.        Thought Content: Thought content normal.        Judgment: Judgment normal.          Patient has been counseled extensively about nutrition and exercise as well as the importance of adherence with medications and regular follow-up. The patient was given clear instructions to go to ER or return to medical center if symptoms don't improve, worsen or new problems develop. The patient verbalized understanding.   Follow-up: Return if symptoms worsen or fail to improve.   [name], FNP-BC [facility] [place], [place]   09/29/2019, 12:57 PM

## 2019-09-29 NOTE — Progress Notes (Signed)
Following up on elevated liver enzymes.  Has c/o of bloating x 3-4 weeks. Thinks it might be leaky gut.

## 2019-09-29 NOTE — Patient Instructions (Signed)
Abdominal Bloating When you have abdominal bloating, your abdomen may feel full, tight, or painful. It may also look bigger than normal or swollen (distended). Common causes of abdominal bloating include:  Swallowing air.  Constipation.  Problems digesting food.  Eating too much.  Irritable bowel syndrome. This is a condition that affects the large intestine.  Lactose intolerance. This is an inability to digest lactose, a natural sugar in dairy products.  Celiac disease. This is a condition that affects the ability to digest gluten, a protein found in some grains.  Gastroparesis. This is a condition that slows down the movement of food in the stomach and small intestine. It is more common in people with diabetes mellitus.  Gastroesophageal reflux disease (GERD). This is a digestive condition that makes stomach acid flow back into the esophagus.  Urinary retention. This means that the body is holding onto urine, and the bladder cannot be emptied all the way. Follow these instructions at home: Eating and drinking  Avoid eating too much.  Try not to swallow air while talking or eating.  Avoid eating while lying down.  Avoid these foods and drinks: ? Foods that cause gas, such as broccoli, cabbage, cauliflower, and baked beans. ? Carbonated drinks. ? Hard candy. ? Chewing gum. Medicines  Take over-the-counter and prescription medicines only as told by your health care provider.  Take probiotic medicines. These medicines contain live bacteria or yeasts that can help digestion.  Take coated peppermint oil capsules. Activity  Try to exercise regularly. Exercise may help to relieve bloating that is caused by gas and relieve constipation. General instructions  Keep all follow-up visits as told by your health care provider. This is important. Contact a health care provider if:  You have nausea and vomiting.  You have diarrhea.  You have abdominal pain.  You have unusual  weight loss or weight gain.  You have severe pain, and medicines do not help. Get help right away if:  You have severe chest pain.  You have trouble breathing.  You have shortness of breath.  You have trouble urinating.  You have darker urine than normal.  You have blood in your stools or have dark, tarry stools. Summary  Abdominal bloating means that the abdomen is swollen.  Common causes of abdominal bloating are swallowing air, constipation, and problems digesting food.  Avoid eating too much and avoid swallowing air.  Avoid foods that cause gas, carbonated drinks, hard candy, and chewing gum. This information is not intended to replace advice given to you by your health care provider. Make sure you discuss any questions you have with your health care provider. Document Released: 01/03/2017 Document Revised: 03/22/2019 Document Reviewed: 01/03/2017 Elsevier Patient Education  2020 [employer]  

## 2019-09-30 LAB — CMP14+EGFR
ALT: 21 IU/L (ref 0–44)
AST: 18 IU/L (ref 0–40)
Albumin/Globulin Ratio: 1.9 (ref 1.2–2.2)
Albumin: 4.4 g/dL (ref 4.0–5.0)
Alkaline Phosphatase: 76 IU/L (ref 39–117)
BUN/Creatinine Ratio: 9 (ref 9–20)
BUN: 9 mg/dL (ref 6–20)
Bilirubin Total: 0.9 mg/dL (ref 0.0–1.2)
CO2: 27 mmol/L (ref 20–29)
Calcium: 9.7 mg/dL (ref 8.7–10.2)
Chloride: 103 mmol/L (ref 96–106)
Creatinine, Ser: 0.95 mg/dL (ref 0.76–1.27)
GFR calc Af Amer: 123 mL/min/{1.73_m2} (ref >59)
GFR calc non Af Amer: 106 mL/min/{1.73_m2} (ref >59)
Globulin, Total: 2.3 g/dL (ref 1.5–4.5)
Glucose: 96 mg/dL (ref 65–99)
Potassium: 4.4 mmol/L (ref 3.5–5.2)
Sodium: 142 mmol/L (ref 134–144)
Total Protein: 6.7 g/dL (ref 6.0–8.5)

## 2019-09-30 LAB — LIPID PANEL
Chol/HDL Ratio: 3.1 ratio (ref 0.0–5.0)
Cholesterol, Total: 144 mg/dL (ref 100–199)
HDL: 47 mg/dL (ref >39)
LDL Chol Calc (NIH): 82 mg/dL (ref 0–99)
Triglycerides: 75 mg/dL (ref 0–149)
VLDL Cholesterol Cal: 15 mg/dL (ref 5–40)

## 2019-09-30 LAB — CBC
Hematocrit: 45 % (ref 37.5–51.0)
Hemoglobin: 15.3 g/dL (ref 13.0–17.7)
MCH: 30.3 pg (ref 26.6–33.0)
MCHC: 34 g/dL (ref 31.5–35.7)
MCV: 89 fL (ref 79–97)
Platelets: 292 10*3/uL (ref 150–450)
RBC: 5.05 x10E6/uL (ref 4.14–5.80)
RDW: 11.7 % (ref 11.6–15.4)
WBC: 4.6 10*3/uL (ref 3.4–10.8)

## 2019-09-30 LAB — CK: Total CK: 80 U/L (ref 49–439)

## 2020-07-14 ENCOUNTER — Ambulatory Visit (HOSPITAL_COMMUNITY)
Admission: EM | Admit: 2020-07-14 | Discharge: 2020-07-14 | Disposition: A | Discharge status: Home / Self Care | Payer: Managed Care, Other (non HMO) | Attending: Physician Assistant | Admitting: Physician Assistant

## 2020-07-14 ENCOUNTER — Encounter (HOSPITAL_COMMUNITY): Payer: Self-pay

## 2020-07-14 ENCOUNTER — Other Ambulatory Visit: Payer: Self-pay

## 2020-07-14 DIAGNOSIS — R200 Anesthesia of skin: Secondary | ICD-10-CM | POA: Diagnosis not present

## 2020-07-14 MED ORDER — IBUPROFEN 400 MG PO TABS: 400.0000 mg | ORAL_TABLET | Freq: Four times a day (QID) | ORAL | 0 refills | Status: DC | PRN

## 2020-07-14 NOTE — ED Provider Notes (Signed)
[facility]    CSN: [id number] Arrival date & time: 07/14/20  1341      History   Chief Complaint Chief Complaint  Patient presents with  . right thigh 'numbness    HPI Dustin Garcia is a 34 y.o. male.   Patient reports for numbness over his right thigh.  This started yesterday evening.  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  He noticed the numbness when he is in the shower rubbing his right thigh.  He reports he has sensation however it is diminished when compared to the left.  Denies weakness or numbness elsewhere in the leg.  He has never had this before.  Denies trauma.  Denies wearing tight clothes or a belt yesterday.  Did not notice this prior to after work was completed.  Denies back pain.  No back injury history.  Denies any rash or skin changes.       Past Medical History:  Diagnosis Date  . Rhabdomyolysis     Patient Active Problem List   Diagnosis Date Noted  . Non-traumatic rhabdomyolysis 01/25/2019    No past surgical history on file.     Home Medications    Prior to Admission medications   Medication Sig Start Date End Date Taking? Authorizing Provider  ibuprofen (ADVIL) 400 MG tablet Take 1 tablet (400 mg total) by mouth every 6 (six) hours as needed. 07/14/20   Finas Delone, Veryl Speak, PA-C    Family History Family History  Problem Relation Age of Onset  . Healthy Mother   . Healthy Father     Social History Social History   Tobacco Use  . Smoking status: Never Smoker  . Smokeless tobacco: Never Used  Substance Use Topics  . Alcohol use: No  . Drug use: No     Allergies   Tamiflu [oseltamivir]   Review of Systems Review of Systems   Physical Exam Triage Vital Signs ED Triage Vitals  Enc Vitals Group     BP 07/14/20 1441 112/67     Pulse Rate 07/14/20 1441 57     Resp 07/14/20 1441 18     Temp 07/14/20 1441 98.2 F (36.8 C)     Temp Source 07/14/20 1441 Oral     SpO2 07/14/20 1441 100 %     Weight --       Height --      Head Circumference --      Peak Flow --      Pain Score 07/14/20 1442 0     Pain Loc --      Pain Edu? --      Excl. in GC? --    No data found.  Updated Vital Signs BP 112/67 (BP Location: Right Arm)   Pulse 57   Temp 98.2 F (36.8 C) (Oral)   Resp 18   SpO2 100%   Visual Acuity Right Eye Distance:   Left Eye Distance:   Bilateral Distance:    Right Eye Near:   Left Eye Near:    Bilateral Near:     Physical Exam Vitals and nursing note reviewed.  Constitutional:      Appearance: Normal appearance. He is well-developed. He is not ill-appearing.  HENT:     Head: Normocephalic and atraumatic.  Eyes:     Conjunctiva/sclera: Conjunctivae normal.  Cardiovascular:     Rate and Rhythm: Normal rate.     Heart sounds: No murmur heard.   Pulmonary:  Effort: Pulmonary effort is normal. No respiratory distress.  Musculoskeletal:     Cervical back: Neck supple.  Skin:    General: Skin is warm and dry.     Findings: No rash.  Neurological:     General: No focal deficit present.     Mental Status: He is alert and oriented to person, place, and time.     Motor: No weakness.     Coordination: Coordination normal.     Gait: Gait normal.     Comments: Diminished sensation to light touch over the anterior and lateral right thigh when compared to the left.  Patient does have sensation however it is simply diminished when compared to the left.  Change in sensation to the inner thigh.  Sensation is equal and intact bilaterally through the remainder of the lower extremity.  Strength 5/5 patient is ambulatory.      UC Treatments / Results  Labs (all labs ordered are listed, but only abnormal results are displayed) Labs Reviewed - No data to display  EKG   Radiology No results found.  Procedures Procedures (including critical care time)  Medications Ordered in UC Medications - No data to display  Initial Impression / Assessment and Plan / UC  Course  I have reviewed the triage vital signs and the nursing notes.  Pertinent labs & imaging results that were available during my care of the patient were reviewed by me and considered in my medical decision making (see chart for details).      #numbness of anterior right thigh Patient is a 34 year old with numbness to the anterior right thigh.  No other deficits.  Appears isolated to sensation only.  Likely this is irritation of the femoral nerve during heavy work yesterday.  Discussed this with the patient and stated that we should monitor this and that he should return or go to emergency department with any significant changes.  Short-term follow-up with his primary care about this as well.  Did discuss we could try an NSAID course as well.  Patient verbalized understanding plan of care Final Clinical Impressions(s) / UC Diagnoses   Final diagnoses:  Numbness of right anterior thigh     Discharge Instructions     Try the ibuprofen  If numbness not improving, follow up with your primary care  If acutely worsening with weakness, rash, severe pain return or go to the Emergency Department      ED Prescriptions    Medication Sig Dispense Auth. Provider   ibuprofen (ADVIL) 400 MG tablet Take 1 tablet (400 mg total) by mouth every 6 (six) hours as needed. 30 tablet Memori Sammon, Veryl Speak, PA-C     PDMP not reviewed this encounter.   Hermelinda Medicus, PA-C 07/15/20 [phone]

## 2020-07-14 NOTE — Discharge Instructions (Signed)
Try the ibuprofen  If numbness not improving, follow up with your primary care  If acutely worsening with weakness, rash, severe pain return or go to the Emergency Department

## 2020-07-14 NOTE — ED Triage Notes (Signed)
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 Denies injury/trauma to area or pain. Able to feel/sense light to moderate palpation. Denies CP, SOB, slurred speech, dizziness, weakness to other extremities, rash/bite to right thigh.  Able to feel toes/feet. Pt states h/o of rhabdomyolysis when taking tamiflu, denies recent use of any medication.

## 2020-08-16 ENCOUNTER — Telehealth (INDEPENDENT_AMBULATORY_CARE_PROVIDER_SITE_OTHER): Payer: Managed Care, Other (non HMO) | Admitting: Internal Medicine

## 2020-08-16 ENCOUNTER — Encounter: Payer: Self-pay | Admitting: Internal Medicine

## 2020-08-16 ENCOUNTER — Other Ambulatory Visit: Payer: Managed Care, Other (non HMO)

## 2020-08-16 DIAGNOSIS — R7989 Other specified abnormal findings of blood chemistry: Secondary | ICD-10-CM | POA: Diagnosis not present

## 2020-08-16 DIAGNOSIS — R200 Anesthesia of skin: Secondary | ICD-10-CM | POA: Diagnosis not present

## 2020-08-16 MED ORDER — CYCLOBENZAPRINE HCL 10 MG PO TABS: 10.0000 mg | ORAL_TABLET | Freq: Three times a day (TID) | ORAL | 0 refills | Status: DC | PRN

## 2020-08-16 NOTE — Progress Notes (Signed)
Virtual Visit via Telephone Note  I connected with Dustin Garcia, on 08/16/2020 at 11:12 AM by telephone due to the COVID-19 pandemic and verified that I am speaking with the correct person using two identifiers.   Consent: I discussed the limitations, risks, security and privacy concerns of performing an evaluation and management service by telephone and the availability of in person appointments. I also discussed with the patient that there may be a patient responsible charge related to this service. The patient expressed understanding and agreed to proceed.   Location of Patient: Home   Location of Provider: Clinic    Persons participating in Telemedicine visit: [name] [facility] Dr. Earlene Plater      History of Present Illness: Patient has a visit for a couple acute concerns. Wants to have his liver function tested due to abnormality in the recent past.   Built a shed 3 weeks ago. Reports that his right leg from his knee cap to his waist goes numb when he gets hot. Present on the anterior aspect. Reports that its not a complete numbness. Seems to have happened after building the shed. No known injury. No change in ambulation. No saddle anesthesia. No back pain. No change in bowel or bladder.    Past Medical History:  Diagnosis Date  . Rhabdomyolysis    Allergies  Allergen Reactions  . Tamiflu [Oseltamivir]     Possible Rhabdomyolysis reaction      Current Outpatient Medications on File Prior to Visit  Medication Sig Dispense Refill  . ibuprofen (ADVIL) 400 MG tablet Take 1 tablet (400 mg total) by mouth every 6 (six) hours as needed. 30 tablet 0   No current facility-administered medications on file prior to visit.    Observations/Objective: NAD. Speaking clearly.  Work of breathing normal.  Alert and oriented. Mood appropriate.   Assessment and Plan: 1. Elevated LFTs History of elevated LFTs related to rhabdomyolysis in setting of Tamiflu use. LFTs  had normalized and were within normal limits in Oct 2020. Patient requests to have monitored.  - Comprehensive metabolic panel; Future  2. Leg numbness Suspect related to some nerve compression from overuse injury/strain. No red flag symptoms. Will also trial some muscle relaxer for pain relief and to see if reduces some strain that could be impacting innervation.  - cyclobenzaprine (FLEXERIL) 10 MG tablet; Take 1 tablet (10 mg total) by mouth 3 (three) times daily as needed for muscle spasms.  Dispense: 30 tablet; Refill: 0   Follow Up Instructions: Routine medical care    I discussed the assessment and treatment plan with the patient. The patient was provided an opportunity to ask questions and all were answered. The patient agreed with the plan and demonstrated an understanding of the instructions.   The patient was advised to call back or seek an in-person evaluation if the symptoms worsen or if the condition fails to improve as anticipated.     I provided 24 minutes total of non-face-to-face time during this encounter including median intraservice time, reviewing previous notes, investigations, ordering medications, medical decision making, coordinating care and patient verbalized understanding at the end of the visit.    Marcy Siren, D.O. Primary Care at [facility]  08/16/2020, 11:12 AM

## 2020-08-17 LAB — COMPREHENSIVE METABOLIC PANEL
ALT: 44 IU/L (ref 0–44)
AST: 26 IU/L (ref 0–40)
Albumin/Globulin Ratio: 2 (ref 1.2–2.2)
Albumin: 4.6 g/dL (ref 4.0–5.0)
Alkaline Phosphatase: 98 IU/L (ref 48–121)
BUN/Creatinine Ratio: 14 (ref 9–20)
BUN: 13 mg/dL (ref 6–20)
Bilirubin Total: 0.8 mg/dL (ref 0.0–1.2)
CO2: 27 mmol/L (ref 20–29)
Calcium: 9.4 mg/dL (ref 8.7–10.2)
Chloride: 99 mmol/L (ref 96–106)
Creatinine, Ser: 0.94 mg/dL (ref 0.76–1.27)
GFR calc Af Amer: 124 mL/min/{1.73_m2} (ref >59)
GFR calc non Af Amer: 107 mL/min/{1.73_m2} (ref >59)
Globulin, Total: 2.3 g/dL (ref 1.5–4.5)
Glucose: 88 mg/dL (ref 65–99)
Potassium: 4.5 mmol/L (ref 3.5–5.2)
Sodium: 138 mmol/L (ref 134–144)
Total Protein: 6.9 g/dL (ref 6.0–8.5)

## 2020-11-21 ENCOUNTER — Encounter (HOSPITAL_COMMUNITY): Payer: Self-pay | Admitting: *Deleted

## 2020-11-21 ENCOUNTER — Ambulatory Visit (HOSPITAL_COMMUNITY)
Admission: EM | Admit: 2020-11-21 | Discharge: 2020-11-21 | Disposition: A | Discharge status: Home / Self Care | Payer: Managed Care, Other (non HMO) | Attending: Emergency Medicine | Admitting: Emergency Medicine

## 2020-11-21 ENCOUNTER — Other Ambulatory Visit: Payer: Self-pay

## 2020-11-21 DIAGNOSIS — R21 Rash and other nonspecific skin eruption: Secondary | ICD-10-CM

## 2020-11-21 MED ORDER — NYSTATIN-TRIAMCINOLONE 100000-0.1 UNIT/GM-% EX CREA: TOPICAL_CREAM | CUTANEOUS | 0 refills | Status: DC

## 2020-11-21 NOTE — ED Provider Notes (Signed)
[facility]    CSN: [id number] Arrival date & time: 11/21/20  1910      History   Chief Complaint Chief Complaint  Patient presents with  . Rash    HPI Dustin Garcia is a 34 y.o. male.   Penile rash for the past week. Has been applying topical antibiotic ointment which hasn't helped. No pain or itch. Denies concerns for stds. No swelling. No urinary symptoms. No scrotal redness or swelling. Denies any previous similar.    ROS per HPI, negative if not otherwise mentioned.      Past Medical History:  Diagnosis Date  . Rhabdomyolysis     Patient Active Problem List   Diagnosis Date Noted  . Non-traumatic rhabdomyolysis 01/25/2019    History reviewed. No pertinent surgical history.     Home Medications    Prior to Admission medications   Medication Sig Start Date End Date Taking? Authorizing Provider  cyclobenzaprine (FLEXERIL) 10 MG tablet Take 1 tablet (10 mg total) by mouth 3 (three) times daily as needed for muscle spasms. 08/16/20   Arvilla Market, DO  nystatin-triamcinolone (MYCOLOG II) cream Apply to affected area daily 11/21/20   Georgetta Haber, NP    Family History Family History  Problem Relation Age of Onset  . Healthy Mother   . Healthy Father     Social History Social History   Tobacco Use  . Smoking status: Never Smoker  . Smokeless tobacco: Never Used  Substance Use Topics  . Alcohol use: No  . Drug use: No     Allergies   Tamiflu [oseltamivir]   Review of Systems Review of Systems   Physical Exam Triage Vital Signs ED Triage Vitals  Enc Vitals Group     BP 11/21/20 1935 122/65     Pulse Rate 11/21/20 1935 62     Resp 11/21/20 1935 15     Temp 11/21/20 1935 98.5 F (36.9 C)     Temp Source 11/21/20 1935 Oral     SpO2 11/21/20 1935 99 %     Weight 11/21/20 1937 180 lb (81.6 kg)     Height 11/21/20 1937 5\' 7"  (1.702 m)     Head Circumference --      Peak Flow --      Pain Score 11/21/20 1937 0       Pain Loc --      Pain Edu? --      Excl. in GC? --    No data found.  Updated Vital Signs BP 122/65 (BP Location: Right Arm)   Pulse 62   Temp 98.5 F (36.9 C) (Oral)   Resp 15   Ht 5\' 7"  (1.702 m)   Wt 180 lb (81.6 kg)   SpO2 99%   BMI 28.19 kg/m   Visual Acuity Right Eye Distance:   Left Eye Distance:   Bilateral Distance:    Right Eye Near:   Left Eye Near:    Bilateral Near:     Physical Exam Exam conducted with a chaperone present.  Constitutional:      Appearance: He is well-developed.  Cardiovascular:     Rate and Rhythm: Normal rate.  Pulmonary:     Effort: Pulmonary effort is normal.  Genitourinary:    Penis: Circumcised.        Comments: Plaque-like rash along rim of glans, hypopigmented; no ulceration, no papules; no fluid or drainage; non tender  Skin:    General: Skin is warm and dry.  Neurological:     Mental Status: He is alert and oriented to person, place, and time.      UC Treatments / Results  Labs (all labs ordered are listed, but only abnormal results are displayed) Labs Reviewed - No data to display  EKG   Radiology No results found.  Procedures Procedures (including critical care time)  Medications Ordered in UC Medications - No data to display  Initial Impression / Assessment and Plan / UC Course  I have reviewed the triage vital signs and the nursing notes.  Pertinent labs & imaging results that were available during my care of the patient were reviewed by me and considered in my medical decision making (see chart for details).     Irritant dermatitis vs fungal source of rash considered with combination cream provided. Return precautions provided. Patient verbalized understanding and agreeable to plan.   Final Clinical Impressions(s) / UC Diagnoses   Final diagnoses:  Penile rash     Discharge Instructions     Apply thin amount of the provided cream to the affected area twice a day.  Limit any  aggravation to the area or any other products.  Return for any worsening or no improvement in the next two weeks.     ED Prescriptions    Medication Sig Dispense Auth. Provider   nystatin-triamcinolone (MYCOLOG II) cream Apply to affected area daily 15 g Linus Mako B, NP     PDMP not reviewed this encounter.   Georgetta Haber, NP 11/21/20 2036

## 2020-11-21 NOTE — Discharge Instructions (Addendum)
Apply thin amount of the provided cream to the affected area twice a day.  Limit any aggravation to the area or any other products.  Return for any worsening or no improvement in the next two weeks.

## 2020-11-21 NOTE — ED Triage Notes (Signed)
Pt reports he has a rash to groin for 2 weeks.

## 2021-06-21 ENCOUNTER — Ambulatory Visit
Admission: RE | Admit: 2021-06-21 | Discharge: 2021-06-21 | Disposition: A | Discharge status: Home / Self Care | Payer: Managed Care, Other (non HMO) | Source: Ambulatory Visit | Attending: Physician Assistant | Admitting: Physician Assistant

## 2021-06-21 ENCOUNTER — Other Ambulatory Visit: Payer: Self-pay

## 2021-06-21 ENCOUNTER — Telehealth (HOSPITAL_BASED_OUTPATIENT_CLINIC_OR_DEPARTMENT_OTHER): Payer: Self-pay

## 2021-06-21 VITALS — BP 117/65 | HR 69 | Temp 98.1°F | Resp 18

## 2021-06-21 DIAGNOSIS — M545 Low back pain, unspecified: Secondary | ICD-10-CM | POA: Diagnosis not present

## 2021-06-21 DIAGNOSIS — R200 Anesthesia of skin: Secondary | ICD-10-CM

## 2021-06-21 MED ORDER — CYCLOBENZAPRINE HCL 10 MG PO TABS: 10.0000 mg | ORAL_TABLET | Freq: Three times a day (TID) | ORAL | 0 refills | Status: DC | PRN

## 2021-06-21 MED ORDER — NAPROXEN 500 MG PO TABS: 500.0000 mg | ORAL_TABLET | Freq: Two times a day (BID) | ORAL | 0 refills | Status: DC

## 2021-06-21 NOTE — ED Provider Notes (Signed)
[facility]    CSN: [id number] Arrival date & time: 06/21/21  1254      History   Chief Complaint Chief Complaint  Patient presents with   appointment @1pm     Back Pain    Lumbar     HPI Dustin Garcia is a 35 y.o. male.   Patient presents today with a 3-week history of lower back pain.  He reports pain is currently rated 5 on a 0-10 pain scale, localized to lower back without radiation, described as aching with periodic sharp pains, worse with certain movements, no alleviating factors identified.  He denies previous injury or surgery to back.  States that he was playing basketball when he felt something pop in his back and has had persistent pain since that time.  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  He is considering filing for short-term disability.  He denies any weakness, numbness, paresthesias, inability to ambulate.  He has tried over-the-counter medications without improvement of symptoms.  He is unable to perform daily activities as result of symptoms.   Past Medical History:  Diagnosis Date   Rhabdomyolysis     Patient Active Problem List   Diagnosis Date Noted   Non-traumatic rhabdomyolysis 01/25/2019    History reviewed. No pertinent surgical history.     Home Medications    Prior to Admission medications   Medication Sig Start Date End Date Taking? Authorizing Provider  naproxen (NAPROSYN) 500 MG tablet Take 1 tablet (500 mg total) by mouth 2 (two) times daily. 06/21/21  Yes Jodi Kappes K, PA-C  cyclobenzaprine (FLEXERIL) 10 MG tablet Take 1 tablet (10 mg total) by mouth 3 (three) times daily as needed for muscle spasms. 06/21/21   Kamilia Carollo, 08/22/21, PA-C    Family History Family History  Problem Relation Age of Onset   Healthy Mother    Healthy Father     Social History Social History   Tobacco  Use   Smoking status: Never   Smokeless tobacco: Never  Substance Use Topics   Alcohol use: No   Drug use: No     Allergies   Tamiflu [oseltamivir]   Review of Systems Review of Systems  Constitutional:  Positive for activity change. Negative for appetite change, fatigue and fever.  Respiratory:  Negative for cough and shortness of breath.   Cardiovascular:  Negative for chest pain.  Gastrointestinal:  Negative for abdominal pain, diarrhea, nausea and vomiting.  Musculoskeletal:  Positive for back pain. Negative for arthralgias and myalgias.  Neurological:  Negative for dizziness, weakness, light-headedness and headaches.    Physical Exam Triage Vital Signs ED Triage Vitals  Enc Vitals Group     BP 06/21/21 1304 117/65     Pulse Rate 06/21/21 1304 69     Resp 06/21/21 1304 18     Temp 06/21/21 1304 98.1 F (36.7 C)     Temp Source 06/21/21 1304 Oral     SpO2 06/21/21 1304 95 %     Weight --      Height --      Head Circumference --      Peak Flow --      Pain Score 06/21/21 1307 5     Pain Loc --      Pain Edu? --      Excl. in GC? --  No data found.  Updated Vital Signs BP 117/65 (BP Location: Left Arm)   Pulse 69   Temp 98.1 F (36.7 C) (Oral)   Resp 18   SpO2 95%   Visual Acuity Right Eye Distance:   Left Eye Distance:   Bilateral Distance:    Right Eye Near:   Left Eye Near:    Bilateral Near:     Physical Exam Vitals reviewed.  Constitutional:      General: He is awake.     Appearance: Normal appearance. He is normal weight. He is not ill-appearing.     Comments: Very pleasant male appears stated age in no acute distress  HENT:     Head: Normocephalic and atraumatic.  Cardiovascular:     Rate and Rhythm: Normal rate and regular rhythm.     Heart sounds: Normal heart sounds, S1 normal and S2 normal. No murmur heard. Pulmonary:     Effort: Pulmonary effort is normal.     Breath sounds: Normal breath sounds. No stridor. No wheezing,  rhonchi or rales.     Comments: Clear to auscultation bilaterally Musculoskeletal:     Cervical back: No tenderness or bony tenderness.     Thoracic back: No tenderness or bony tenderness.     Lumbar back: Tenderness present. No spasms or bony tenderness. Negative right straight leg raise test and negative left straight leg raise test.     Comments: No pain percussion of vertebrae.  Mild tenderness palpation of right paraspinal lumbar muscles.  No deformity noted.  Normal active range of motion.  Negative straight leg raise and Faber bilaterally.  Neurological:     Mental Status: He is alert.  Psychiatric:        Behavior: Behavior is cooperative.     UC Treatments / Results  Labs (all labs ordered are listed, but only abnormal results are displayed) Labs Reviewed - No data to display  EKG   Radiology No results found.  Procedures Procedures (including critical care time)  Medications Ordered in UC Medications - No data to display  Initial Impression / Assessment and Plan / UC Course  I have reviewed the triage vital signs and the nursing notes.  Pertinent labs & imaging results that were available during my care of the patient were reviewed by me and considered in my medical decision making (see chart for details).      No indication for x-rays given atraumatic injury and no bony tenderness on exam with only 3 weeks of symptoms.  Patient was started on Naprosyn with instruction not to take additional NSAIDs.  He was prescribed Flexeril to be taken up to 3 times a day with instruction not to drive or drink alcohol due to associated sedation.  Discussed that ultimately he may need physical therapy referral and/or MRI this is not something that can be done through an urgent care and he will need to follow-up with his primary care provider.  Also discussed that we do not complete short-term disability paperwork and this would need to be done through PCP.  Discussed alarm symptoms  that warrant emergent evaluation.  Strict return precautions given to which patient expressed understanding.  Final Clinical Impressions(s) / UC Diagnoses   Final diagnoses:  Acute bilateral low back pain without sciatica     Discharge Instructions      Follow-up with primary care provider as we discussed as you may need to see a physical therapist and/or have MRI if symptoms do not improve with  medication regimen.  Take Naprosyn twice daily.  This is an NSAID so do not take additional NSAIDs including aspirin, ibuprofen/Advil, naproxen/Aleve as it can cause stomach bleeding.  Take Flexeril up to 3 times a day as needed.  This can make you very sleepy so do not drive or drink alcohol while taking this.  Use heat and rest for symptom relief.  If you have any sudden worsening of symptoms including severe pain, difficulty moving your legs, numbness, tingling sensation you need to be seen immediately.     ED Prescriptions     Medication Sig Dispense Auth. Provider   cyclobenzaprine (FLEXERIL) 10 MG tablet  (Status: Discontinued) Take 1 tablet (10 mg total) by mouth 3 (three) times daily as needed for muscle spasms. 30 tablet Kobee Medlen K, PA-C   naproxen (NAPROSYN) 500 MG tablet Take 1 tablet (500 mg total) by mouth 2 (two) times daily. 30 tablet Thayden Lemire K, PA-C   cyclobenzaprine (FLEXERIL) 10 MG tablet Take 1 tablet (10 mg total) by mouth 3 (three) times daily as needed for muscle spasms. 30 tablet Vonzella Althaus, Noberto Retort, PA-C      PDMP not reviewed this encounter.   Jeani Hawking, PA-C 06/21/21 1332

## 2021-06-21 NOTE — Telephone Encounter (Signed)
-----   Message from Aaron Edelman, RN sent at 06/21/2021  3:53 PM EDT ----- Regarding: UC to PCP Patient needs to establish with PCP - routine

## 2021-06-21 NOTE — Discharge Instructions (Signed)
Follow-up with primary care provider as we discussed as you may need to see a physical therapist and/or have MRI if symptoms do not improve with medication regimen.  Take Naprosyn twice daily.  This is an NSAID so do not take additional NSAIDs including aspirin, ibuprofen/Advil, naproxen/Aleve as it can cause stomach bleeding.  Take Flexeril up to 3 times a day as needed.  This can make you very sleepy so do not drive or drink alcohol while taking this.  Use heat and rest for symptom relief.  If you have any sudden worsening of symptoms including severe pain, difficulty moving your legs, numbness, tingling sensation you need to be seen immediately.

## 2021-06-21 NOTE — ED Triage Notes (Signed)
Three weeks ago, while playing basketball Pt reports he twisted his back in the opposite direction of his feet causing a sudden onset of low back pain. Pt reports that pain was worst at the onset and gradually decreased after three days. Today, he continues to complain of low back pain, right side greater than left and stiffness. No other falls or injuries noted. Has been taking OTC meds with some relief.

## 2021-07-19 ENCOUNTER — Ambulatory Visit (HOSPITAL_BASED_OUTPATIENT_CLINIC_OR_DEPARTMENT_OTHER): Payer: Managed Care, Other (non HMO) | Admitting: Family Medicine

## 2021-07-23 ENCOUNTER — Encounter (HOSPITAL_BASED_OUTPATIENT_CLINIC_OR_DEPARTMENT_OTHER): Payer: Self-pay

## 2021-07-23 ENCOUNTER — Ambulatory Visit (HOSPITAL_BASED_OUTPATIENT_CLINIC_OR_DEPARTMENT_OTHER): Payer: Managed Care, Other (non HMO) | Admitting: Family Medicine

## 2021-07-31 ENCOUNTER — Ambulatory Visit (INDEPENDENT_AMBULATORY_CARE_PROVIDER_SITE_OTHER): Payer: Managed Care, Other (non HMO) | Admitting: Family Medicine

## 2021-07-31 ENCOUNTER — Ambulatory Visit: Payer: Managed Care, Other (non HMO) | Admitting: Family

## 2021-07-31 ENCOUNTER — Encounter: Payer: Self-pay | Admitting: Family Medicine

## 2021-07-31 ENCOUNTER — Other Ambulatory Visit: Payer: Self-pay

## 2021-07-31 VITALS — BP 115/75 | HR 61 | Temp 97.5°F | Resp 16 | Ht 66.0 in | Wt 191.0 lb

## 2021-07-31 DIAGNOSIS — M545 Low back pain, unspecified: Secondary | ICD-10-CM

## 2021-07-31 MED ORDER — TIZANIDINE HCL 4 MG PO CAPS: 4.0000 mg | ORAL_CAPSULE | Freq: Three times a day (TID) | ORAL | 0 refills | Status: DC

## 2021-07-31 MED ORDER — PREDNISONE 50 MG PO TABS: ORAL_TABLET | ORAL | 0 refills | Status: DC

## 2021-07-31 NOTE — Progress Notes (Signed)
Back pain Middle to lower 2/10 presently  Intermittent worse with heavy lifting.

## 2021-07-31 NOTE — Progress Notes (Signed)
   Established Patient Office Visit  Subjective:  Patient ID: Dustin Garcia, male    DOB: Jun 30, 1986  Age: 35 y.o. MRN: [id number]  CC:  Chief Complaint  Patient presents with   Back Pain    HPI Dustin Garcia presents for complaint of back pain. Patient reports that several weeks ago that he was playing basketball when he experienced acute onset of back pain. His sx have been intermittently exacerbated as he does heavy lifting at work.     Outpatient Medications Prior to Visit  Medication Sig Dispense Refill   cyclobenzaprine (FLEXERIL) 10 MG tablet Take 1 tablet (10 mg total) by mouth 3 (three) times daily as needed for muscle spasms. (Patient not taking: Reported on 07/31/2021) 30 tablet 0   naproxen (NAPROSYN) 500 MG tablet Take 1 tablet (500 mg total) by mouth 2 (two) times daily. (Patient not taking: Reported on 07/31/2021) 30 tablet 0   No facility-administered medications prior to visit.    Allergies  Allergen Reactions   Tamiflu [Oseltamivir]     Possible Rhabdomyolysis reaction      ROS Review of Systems  Musculoskeletal:  Positive for back pain.  All other systems reviewed and are negative.    Objective:    Physical Exam Vitals and nursing note reviewed.  Constitutional:      General: He is not in acute distress. Cardiovascular:     Rate and Rhythm: Normal rate and regular rhythm.  Pulmonary:     Effort: Pulmonary effort is normal.     Breath sounds: Normal breath sounds.  Musculoskeletal:     Lumbar back: No edema or signs of trauma.  Neurological:     General: No focal deficit present.     Mental Status: He is alert and oriented to person, place, and time.    BP 115/75   Pulse 61   Temp (!) 97.5 F (36.4 C)   Resp 16   Ht 5\' 6"  (1.676 m)   Wt 191 lb (86.6 kg)   SpO2 96%   BMI 30.83 kg/m  Wt Readings from Last 3 Encounters:  07/31/21 191 lb (86.6 kg)  11/21/20 180 lb (81.6 kg)  09/29/19 186 lb 12.8 oz (84.7 kg)     Health Maintenance Due   Topic Date Due   HIV Screening  Never done   TETANUS/TDAP  Never done   INFLUENZA VACCINE  07/16/2021    There are no preventive care reminders to display for this patient.           Assessment & Plan:   Problem List Items Addressed This Visit   None 1. Acute bilateral low back pain without sciatica Prednisone and zanaflex prescribed. Utilize tylenol/nsaids prn. Exercises given.      Follow-up: Return in about 6 weeks (around 09/11/2021) for physical.    09/13/2021, MD

## 2021-09-11 ENCOUNTER — Encounter: Payer: Self-pay | Admitting: Family Medicine

## 2021-09-11 ENCOUNTER — Ambulatory Visit (INDEPENDENT_AMBULATORY_CARE_PROVIDER_SITE_OTHER): Payer: Managed Care, Other (non HMO) | Admitting: Family Medicine

## 2021-09-11 ENCOUNTER — Other Ambulatory Visit: Payer: Self-pay

## 2021-09-11 VITALS — BP 100/66 | HR 61 | Temp 98.0°F | Resp 16 | Ht 66.75 in | Wt 189.2 lb

## 2021-09-11 DIAGNOSIS — Z0000 Encounter for general adult medical examination without abnormal findings: Secondary | ICD-10-CM

## 2021-09-11 DIAGNOSIS — Z114 Encounter for screening for human immunodeficiency virus [HIV]: Secondary | ICD-10-CM

## 2021-09-11 NOTE — Progress Notes (Signed)
Patient has no new concerns for provider today 

## 2021-09-11 NOTE — Progress Notes (Signed)
 Established Patient Office Visit  Subjective:  Patient ID: Dustin Garcia, male    DOB: 12/26/1985  Age: 35 y.o. MRN: [id number]  CC:  Chief Complaint  Patient presents with   Annual Exam    HPI [name] presents for routine physical exam. Patient denies acute complaints on today.   Past Medical History:  Diagnosis Date   Rhabdomyolysis     No past surgical history on file.  Family History  Problem Relation Age of Onset   Healthy Mother    Healthy Father     Social History   Socioeconomic History   Marital status: Single    Spouse name: Not on file   Number of children: Not on file   Years of education: Not on file   Highest education level: Not on file  Occupational History   Not on file  Tobacco Use   Smoking status: Never   Smokeless tobacco: Never  Substance and Sexual Activity   Alcohol use: No   Drug use: No   Sexual activity: Not on file  Other Topics Concern   Not on file  Social History Narrative   Not on file   Social Determinants of Health   Financial Resource Strain: Not on file  Food Insecurity: Not on file  Transportation Needs: Not on file  Physical Activity: Not on file  Stress: Not on file  Social Connections: Not on file  Intimate Partner Violence: Not on file    ROS Review of Systems  All other systems reviewed and are negative.  Objective:   Today's Vitals: BP 100/66 (BP Location: Right Arm, Patient Position: Sitting, Cuff Size: Large)   Pulse 61   Temp 98 F (36.7 C) (Oral)   Resp 16   Ht 5' 6.75" (1.695 m)   Wt 189 lb 3.2 oz (85.8 kg)   SpO2 96%   BMI 29.86 kg/m   Physical Exam Vitals and nursing note reviewed.  Constitutional:      General: He is not in acute distress. HENT:     Head: Normocephalic and atraumatic.     Right Ear: Tympanic membrane, ear canal and external ear normal.     Left Ear: Tympanic membrane, ear canal and external ear normal.     Nose: Nose normal.     Mouth/Throat:     Mouth:  Mucous membranes are moist.     Pharynx: Oropharynx is clear.  Eyes:     Conjunctiva/sclera: Conjunctivae normal.     Pupils: Pupils are equal, round, and reactive to light.  Neck:     Thyroid: No thyromegaly.  Cardiovascular:     Rate and Rhythm: Normal rate and regular rhythm.     Heart sounds: Normal heart sounds. No murmur heard. Pulmonary:     Effort: Pulmonary effort is normal.     Breath sounds: Normal breath sounds.  Abdominal:     General: There is no distension.     Palpations: Abdomen is soft. There is no mass.     Tenderness: There is no abdominal tenderness.     Hernia: There is no hernia in the left inguinal area or right inguinal area.  Genitourinary:    Penis: Normal and circumcised.      Testes: Normal.  Musculoskeletal:        General: Normal range of motion.     Cervical back: Normal range of motion and neck supple.     Right lower leg: No edema.     Left lower   leg: No edema.  Skin:    General: Skin is warm and dry.  Neurological:     General: No focal deficit present.     Mental Status: He is alert and oriented to person, place, and time. Mental status is at baseline.  Psychiatric:        Mood and Affect: Mood normal.        Behavior: Behavior normal.    Assessment & Plan:   1. Annual physical exam Unremarkable exam. Routine labs ordered - CMP14+EGFR - CBC with Differential  2. Screening for HIV (human immunodeficiency virus)  - HIV antibody (with reflex)    Outpatient Encounter Medications as of 09/11/2021  Medication Sig   predniSONE (DELTASONE) 50 MG tablet 1 po daily for 5 days   tiZANidine (ZANAFLEX) 4 MG capsule Take 1 capsule (4 mg total) by mouth 3 (three) times daily.   No facility-administered encounter medications on file as of 09/11/2021.    Follow-up: Return in about 1 year (around 09/11/2022) for physical.   Becky Sax, MD

## 2021-09-12 LAB — CMP14+EGFR
ALT: 27 IU/L (ref 0–44)
AST: 19 IU/L (ref 0–40)
Albumin/Globulin Ratio: 2.8 — ABNORMAL HIGH (ref 1.2–2.2)
Albumin: 4.7 g/dL (ref 4.0–5.0)
Alkaline Phosphatase: 74 IU/L (ref 44–121)
BUN/Creatinine Ratio: 11 (ref 9–20)
BUN: 10 mg/dL (ref 6–20)
Bilirubin Total: 1.1 mg/dL (ref 0.0–1.2)
CO2: 24 mmol/L (ref 20–29)
Calcium: 9 mg/dL (ref 8.7–10.2)
Chloride: 103 mmol/L (ref 96–106)
Creatinine, Ser: 0.9 mg/dL (ref 0.76–1.27)
Globulin, Total: 1.7 g/dL (ref 1.5–4.5)
Glucose: 78 mg/dL (ref 70–99)
Potassium: 4.1 mmol/L (ref 3.5–5.2)
Sodium: 141 mmol/L (ref 134–144)
Total Protein: 6.4 g/dL (ref 6.0–8.5)
eGFR: 116 mL/min/{1.73_m2} (ref >59)

## 2021-09-28 ENCOUNTER — Telehealth: Payer: Self-pay | Admitting: Family Medicine

## 2021-09-28 NOTE — Telephone Encounter (Signed)
A call place to patient but no answer. I have LVM  to call office back

## 2021-09-28 NOTE — Telephone Encounter (Signed)
Patient has questions about his lab results from his last appointment. Please call patient.

## 2022-05-14 ENCOUNTER — Ambulatory Visit (HOSPITAL_COMMUNITY): Payer: Self-pay

## 2022-05-20 ENCOUNTER — Encounter (HOSPITAL_BASED_OUTPATIENT_CLINIC_OR_DEPARTMENT_OTHER): Payer: Self-pay | Admitting: Emergency Medicine

## 2022-05-20 ENCOUNTER — Other Ambulatory Visit: Payer: Self-pay

## 2022-05-20 ENCOUNTER — Emergency Department (HOSPITAL_BASED_OUTPATIENT_CLINIC_OR_DEPARTMENT_OTHER)
Admission: EM | Admit: 2022-05-20 | Discharge: 2022-05-20 | Disposition: A | Discharge status: Home / Self Care | Payer: No Typology Code available for payment source | Attending: Emergency Medicine | Admitting: Emergency Medicine

## 2022-05-20 DIAGNOSIS — W228XXA Striking against or struck by other objects, initial encounter: Secondary | ICD-10-CM | POA: Insufficient documentation

## 2022-05-20 DIAGNOSIS — Z23 Encounter for immunization: Secondary | ICD-10-CM | POA: Diagnosis not present

## 2022-05-20 DIAGNOSIS — Y990 Civilian activity done for income or pay: Secondary | ICD-10-CM | POA: Insufficient documentation

## 2022-05-20 DIAGNOSIS — S80812A Abrasion, left lower leg, initial encounter: Secondary | ICD-10-CM | POA: Diagnosis not present

## 2022-05-20 DIAGNOSIS — S8992XA Unspecified injury of left lower leg, initial encounter: Secondary | ICD-10-CM | POA: Diagnosis present

## 2022-05-20 MED ORDER — TETANUS-DIPHTH-ACELL PERTUSSIS 5-2.5-18.5 LF-MCG/0.5 IM SUSY
0.5000 mL | PREFILLED_SYRINGE | Freq: Once | INTRAMUSCULAR | Status: AC
  Administered 2022-05-20: 0.5 mL via INTRAMUSCULAR
  Filled 2022-05-20: qty 0.5

## 2022-05-20 NOTE — ED Provider Notes (Signed)
   [facility]  Provider Note  CSN: [id number] Arrival date & time: 05/20/22 0122  History Chief Complaint  Patient presents with   Leg Injury    Dustin Garcia is a 36 y.o. 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 He sustained an abrasion but denies any other injuries. Unknown TDAP. He was advised to come to the ED by work.    Home Medications Prior to Admission medications   Not on File     Allergies    Tamiflu [oseltamivir]   Review of Systems   Review of Systems Please see HPI for pertinent positives and negatives  Physical Exam BP 119/82 (BP Location: Right Arm)   Pulse (!) 57   Temp 98.6 F (37 C) (Oral)   Resp 20   Ht 5' 6.75" (1.695 m)   Wt 83.9 kg   SpO2 97%   BMI 29.19 kg/m   Physical Exam Vitals and nursing note reviewed.  HENT:     Head: Normocephalic.     Nose: Nose normal.  Eyes:     Extraocular Movements: Extraocular movements intact.  Pulmonary:     Effort: Pulmonary effort is normal.  Musculoskeletal:        General: No swelling, tenderness or deformity. Normal range of motion.     Cervical back: Neck supple.  Skin:    Findings: No rash (on exposed skin).     Comments: Superficial abrasion L anterior lower leg  Neurological:     Mental Status: He is alert and oriented to person, place, and time.  Psychiatric:        Mood and Affect: Mood normal.    ED Results / Procedures / Treatments   EKG None  Procedures Procedures  Medications Ordered in the ED Medications  Tdap (BOOSTRIX) injection 0.5 mL (has no administration in time range)    Initial Impression and Plan  Patient here for abrasion sustained after slipping at work, no other injuries. Plan update TDAP. Basic wound care and discharge home. No indication for xrays at this time.   ED Course       MDM  Rules/Calculators/A&P Medical Decision Making Problems Addressed: Abrasion of anterior left lower leg, initial encounter: acute illness or injury  Risk Prescription drug management.    Final Clinical Impression(s) / ED Diagnoses Final diagnoses:  Abrasion of anterior left lower leg, initial encounter    Rx / DC Orders ED Discharge Orders     None        Pollyann Savoy, MD 05/20/22 [phone]

## 2022-05-20 NOTE — ED Triage Notes (Signed)
Pt with injury to L anterior ankle area. He states he was working at [employer] when he slipped on a wet floor and hit his leg on a rail. Abrasion noted to anterior ankle area, no bleeding at this time. Unsure of last tetanus.
